# Patient Record
Sex: Female | Born: 1990 | Race: White | Hispanic: No | Marital: Single | Smoking: Never smoker
Health system: Southern US, Community
[De-identification: ages and names within clinical notes are randomized; demographics above are authoritative.]

## PROBLEM LIST (undated history)

## (undated) DIAGNOSIS — F39 Unspecified mood [affective] disorder: Secondary | ICD-10-CM

## (undated) DIAGNOSIS — F633 Trichotillomania: Secondary | ICD-10-CM

## (undated) DIAGNOSIS — F429 Obsessive-compulsive disorder, unspecified: Secondary | ICD-10-CM

## (undated) DIAGNOSIS — F329 Major depressive disorder, single episode, unspecified: Secondary | ICD-10-CM

## (undated) DIAGNOSIS — F419 Anxiety disorder, unspecified: Secondary | ICD-10-CM

## (undated) DIAGNOSIS — F32A Depression, unspecified: Secondary | ICD-10-CM

## (undated) DIAGNOSIS — A879 Viral meningitis, unspecified: Secondary | ICD-10-CM

## (undated) HISTORY — DX: Viral meningitis, unspecified: A87.9

## (undated) HISTORY — DX: Obsessive-compulsive disorder, unspecified: F42.9

## (undated) HISTORY — DX: Trichotillomania: F63.3

## (undated) HISTORY — PX: MOUTH SURGERY: SHX715

---

## 2011-07-20 ENCOUNTER — Emergency Department (HOSPITAL_COMMUNITY)
Admission: EM | Admit: 2011-07-20 | Discharge: 2011-07-21 | Disposition: A | Discharge status: Home / Self Care | Payer: Medicaid Other | Attending: Emergency Medicine | Admitting: Emergency Medicine

## 2011-07-20 DIAGNOSIS — T438X2A Poisoning by other psychotropic drugs, intentional self-harm, initial encounter: Secondary | ICD-10-CM | POA: Insufficient documentation

## 2011-07-20 DIAGNOSIS — T43294A Poisoning by other antidepressants, undetermined, initial encounter: Secondary | ICD-10-CM | POA: Insufficient documentation

## 2011-07-20 DIAGNOSIS — F411 Generalized anxiety disorder: Secondary | ICD-10-CM | POA: Insufficient documentation

## 2011-07-20 DIAGNOSIS — F3289 Other specified depressive episodes: Secondary | ICD-10-CM | POA: Insufficient documentation

## 2011-07-20 DIAGNOSIS — R45851 Suicidal ideations: Secondary | ICD-10-CM | POA: Insufficient documentation

## 2011-07-20 DIAGNOSIS — F329 Major depressive disorder, single episode, unspecified: Secondary | ICD-10-CM | POA: Insufficient documentation

## 2011-07-20 DIAGNOSIS — T43502A Poisoning by unspecified antipsychotics and neuroleptics, intentional self-harm, initial encounter: Secondary | ICD-10-CM | POA: Insufficient documentation

## 2011-07-20 DIAGNOSIS — Z79899 Other long term (current) drug therapy: Secondary | ICD-10-CM | POA: Insufficient documentation

## 2011-07-20 LAB — COMPREHENSIVE METABOLIC PANEL
ALT: 14 U/L (ref 0–35)
AST: 22 U/L (ref 0–37)
Alkaline Phosphatase: 52 U/L (ref 39–117)
CO2: 27 mEq/L (ref 19–32)
Calcium: 10.5 mg/dL (ref 8.4–10.5)
GFR calc Af Amer: >60 mL/min (ref >60)
GFR calc non Af Amer: >60 mL/min (ref >60)
Glucose, Bld: 84 mg/dL (ref 70–99)
Potassium: 3.7 mEq/L (ref 3.5–5.1)
Sodium: 137 mEq/L (ref 135–145)
Total Protein: 9.4 g/dL — ABNORMAL HIGH (ref 6.0–8.3)

## 2011-07-20 LAB — URINE MICROSCOPIC-ADD ON

## 2011-07-20 LAB — DIFFERENTIAL
Basophils Absolute: 0 10*3/uL (ref 0.0–0.1)
Basophils Relative: 0 % (ref 0–1)
Lymphocytes Relative: 25 % (ref 12–46)
Neutro Abs: 6.3 10*3/uL (ref 1.7–7.7)
Neutrophils Relative %: 63 % (ref 43–77)

## 2011-07-20 LAB — URINALYSIS, ROUTINE W REFLEX MICROSCOPIC
Glucose, UA: NEGATIVE mg/dL
Specific Gravity, Urine: 1.022 (ref 1.005–1.030)
Urobilinogen, UA: 1 mg/dL (ref 0.0–1.0)

## 2011-07-20 LAB — CBC
HCT: 43 % (ref 36.0–46.0)
Hemoglobin: 14.6 g/dL (ref 12.0–15.0)
WBC: 10.1 10*3/uL (ref 4.0–10.5)

## 2011-07-20 LAB — RAPID URINE DRUG SCREEN, HOSP PERFORMED: Opiates: NOT DETECTED

## 2011-07-20 LAB — POCT PREGNANCY, URINE: Preg Test, Ur: NEGATIVE

## 2011-07-21 LAB — ACETAMINOPHEN LEVEL: Acetaminophen (Tylenol), Serum: <15 ug/mL (ref 10–30)

## 2011-07-21 LAB — SALICYLATE LEVEL: Salicylate Lvl: <2 mg/dL — ABNORMAL LOW (ref 2.8–20.0)

## 2012-04-08 ENCOUNTER — Emergency Department (HOSPITAL_COMMUNITY): Payer: PRIVATE HEALTH INSURANCE

## 2012-04-08 ENCOUNTER — Emergency Department (HOSPITAL_COMMUNITY)
Admission: EM | Admit: 2012-04-08 | Discharge: 2012-04-09 | Disposition: A | Discharge status: Home / Self Care | Payer: PRIVATE HEALTH INSURANCE | Attending: Emergency Medicine | Admitting: Emergency Medicine

## 2012-04-08 ENCOUNTER — Other Ambulatory Visit: Payer: Self-pay

## 2012-04-08 ENCOUNTER — Encounter (HOSPITAL_COMMUNITY): Payer: Self-pay | Admitting: Emergency Medicine

## 2012-04-08 DIAGNOSIS — F341 Dysthymic disorder: Secondary | ICD-10-CM | POA: Insufficient documentation

## 2012-04-08 DIAGNOSIS — Z79899 Other long term (current) drug therapy: Secondary | ICD-10-CM | POA: Insufficient documentation

## 2012-04-08 DIAGNOSIS — F39 Unspecified mood [affective] disorder: Secondary | ICD-10-CM | POA: Insufficient documentation

## 2012-04-08 DIAGNOSIS — F329 Major depressive disorder, single episode, unspecified: Secondary | ICD-10-CM | POA: Insufficient documentation

## 2012-04-08 DIAGNOSIS — F419 Anxiety disorder, unspecified: Secondary | ICD-10-CM | POA: Insufficient documentation

## 2012-04-08 DIAGNOSIS — R1013 Epigastric pain: Secondary | ICD-10-CM | POA: Insufficient documentation

## 2012-04-08 DIAGNOSIS — K219 Gastro-esophageal reflux disease without esophagitis: Secondary | ICD-10-CM | POA: Insufficient documentation

## 2012-04-08 DIAGNOSIS — R079 Chest pain, unspecified: Secondary | ICD-10-CM | POA: Insufficient documentation

## 2012-04-08 HISTORY — DX: Major depressive disorder, single episode, unspecified: F32.9

## 2012-04-08 HISTORY — DX: Anxiety disorder, unspecified: F41.9

## 2012-04-08 HISTORY — DX: Unspecified mood (affective) disorder: F39

## 2012-04-08 HISTORY — DX: Depression, unspecified: F32.A

## 2012-04-08 LAB — CBC
HCT: 39.6 % (ref 36.0–46.0)
Hemoglobin: 13.6 g/dL (ref 12.0–15.0)
RBC: 4.53 MIL/uL (ref 3.87–5.11)
WBC: 13.5 10*3/uL — ABNORMAL HIGH (ref 4.0–10.5)

## 2012-04-08 LAB — COMPREHENSIVE METABOLIC PANEL
ALT: 22 U/L (ref 0–35)
Alkaline Phosphatase: 57 U/L (ref 39–117)
CO2: 25 mEq/L (ref 19–32)
Chloride: 101 mEq/L (ref 96–112)
GFR calc Af Amer: >90 mL/min (ref >90)
Glucose, Bld: 87 mg/dL (ref 70–99)
Potassium: 4 mEq/L (ref 3.5–5.1)
Sodium: 138 mEq/L (ref 135–145)
Total Bilirubin: 0.4 mg/dL (ref 0.3–1.2)
Total Protein: 8.4 g/dL — ABNORMAL HIGH (ref 6.0–8.3)

## 2012-04-08 NOTE — ED Notes (Signed)
Dr.Opitz to eval ecg at 23:08

## 2012-04-08 NOTE — ED Notes (Signed)
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  Denies radiation of pain.  Reports shortness of breath and nausea.  Denies any cardiac history.

## 2012-04-09 LAB — DIFFERENTIAL
Basophils Relative: 0 % (ref 0–1)
Eosinophils Absolute: 0.3 10*3/uL (ref 0.0–0.7)
Eosinophils Relative: 2 % (ref 0–5)
Lymphocytes Relative: 25 % (ref 12–46)
Monocytes Relative: 7 % (ref 3–12)
Neutro Abs: 8.9 10*3/uL — ABNORMAL HIGH (ref 1.7–7.7)
Neutrophils Relative %: 66 % (ref 43–77)

## 2012-04-09 MED ORDER — PANTOPRAZOLE SODIUM 20 MG PO TBEC
20.0000 mg | DELAYED_RELEASE_TABLET | Freq: Once | ORAL | Status: AC
  Administered 2012-04-09: 20 mg via ORAL
  Filled 2012-04-09: qty 1

## 2012-04-09 MED ORDER — GI COCKTAIL ~~LOC~~
30.0000 mL | Freq: Once | ORAL | Status: AC
  Administered 2012-04-09: 30 mL via ORAL
  Filled 2012-04-09: qty 30

## 2012-04-09 MED ORDER — PANTOPRAZOLE SODIUM 20 MG PO TBEC: 20.0000 mg | DELAYED_RELEASE_TABLET | Freq: Once | ORAL | Status: DC

## 2012-04-09 NOTE — ED Provider Notes (Signed)
History     CSN: [id number]  Arrival date & time 04/08/12  2304   First MD Initiated Contact with Patient 04/09/12 0103      Chief Complaint  Patient presents with  . Chest Pain    (Consider location/radiation/quality/duration/timing/severity/associated sxs/prior treatment) HPI Comments: Patient here with acute onset of epigastric pain with radiation up into her chest area - she states that the pain started about 9pm, states that she has had similar pain in the past - states that the previous pain lasted about 1 hour and resolved on it's own - [facility] at that time and they were unable to tell her the cause - she reports that the pain is dull and achy and is beginning to dissapate on it's own - she states that when it was at it's worst it caused nausea and shortness of breath.  She denies fever, chills, cough, congestion, reports she is a non-smoker but does take BCP - denies anxiety, palpiatations, fluttering in her chest.    Patient is a 21 y.o. female presenting with chest pain. The history is provided by the patient. No language interpreter was used.  Chest Pain The chest pain began 3 - 5 hours ago. Chest pain occurs constantly. The chest pain is improving. At its most intense, the pain is at 6/10. The pain is currently at 3/10. The severity of the pain is moderate. The quality of the pain is described as aching and dull. The pain radiates to the epigastrium. Chest pain is worsened by eating. Primary symptoms include shortness of breath and nausea. Pertinent negatives for primary symptoms include no fever, no fatigue, no syncope, no cough, no wheezing, no palpitations, no abdominal pain, no vomiting, no dizziness and no altered mental status.  Pertinent negatives for associated symptoms include no claudication, no diaphoresis, no lower extremity edema, no near-syncope, no numbness, no orthopnea, no paroxysmal nocturnal dyspnea and no weakness. She tried nothing for the  symptoms. Risk factors include oral contraceptive use.     Past Medical History  Diagnosis Date  . Depression   . Anxiety   . Mood disorder     Past Surgical History  Procedure Date  . Mouth surgery     History reviewed. No pertinent family history.  History  Substance Use Topics  . Smoking status: Never Smoker   . Smokeless tobacco: Not on file  . Alcohol Use: Yes     Occassional Use    OB History    Grav Para Term Preterm Abortions TAB SAB Ect Mult Living                  Review of Systems  Constitutional: Negative for fever, diaphoresis and fatigue.  Respiratory: Positive for shortness of breath. Negative for cough and wheezing.   Cardiovascular: Positive for chest pain. Negative for palpitations, orthopnea, claudication, syncope and near-syncope.  Gastrointestinal: Positive for nausea. Negative for vomiting and abdominal pain.  Neurological: Negative for dizziness, weakness and numbness.  Psychiatric/Behavioral: Negative for altered mental status.  All other systems reviewed and are negative.    Allergies  Review of patient's allergies indicates no known allergies.  Home Medications   Current Outpatient Rx  Name Route Sig Dispense Refill  . BIOTIN 5000 PO Oral Take 1 tablet by mouth daily.    . DESVENLAFAXINE SUCCINATE ER 50 MG PO TB24 Oral Take 50 mg by mouth daily.    Marland Kitchen LAMOTRIGINE 25 MG PO TABS Oral Take 50 mg by  mouth daily.    . ADULT MULTIVITAMIN W/MINERALS CH Oral Take 1 tablet by mouth daily.    Marland Kitchen PRESCRIPTION MEDICATION Oral Take 1 tablet by mouth daily.    . QUETIAPINE FUMARATE 25 MG PO TABS Oral Take 25 mg by mouth at bedtime.    Marland Kitchen VITAMIN C 500 MG PO TABS Oral Take 500 mg by mouth daily.      BP 114/86  Pulse 72  Temp(Src) 98.7 F (37.1 C) (Oral)  Resp 18  SpO2 100%  LMP 03/11/2012  Physical Exam  Nursing note and vitals reviewed. Constitutional: She is oriented to person, place, and time. She appears well-developed and  well-nourished. No distress.  HENT:  Head: Normocephalic and atraumatic.  Right Ear: External ear normal.  Left Ear: External ear normal.  Nose: Nose normal.  Mouth/Throat: Oropharynx is clear and moist. No oropharyngeal exudate.  Eyes: Conjunctivae are normal. Pupils are equal, round, and reactive to light. No scleral icterus.  Neck: Normal range of motion. Neck supple.  Cardiovascular: Normal rate, regular rhythm and normal heart sounds.  Exam reveals no gallop and no friction rub.   No murmur heard. Pulmonary/Chest: Effort normal and breath sounds normal. No respiratory distress. She has no wheezes. She has no rales. She exhibits no tenderness.  Abdominal: Soft. Bowel sounds are normal. She exhibits no distension and no mass. There is tenderness in the epigastric area. There is no rebound and no guarding.    Musculoskeletal: Normal range of motion. She exhibits no edema and no tenderness.  Lymphadenopathy:    She has no cervical adenopathy.  Neurological: She is alert and oriented to person, place, and time. No cranial nerve deficit. She exhibits normal muscle tone. Coordination normal.  Skin: Skin is warm and dry. No rash noted. No erythema. No pallor.  Psychiatric: She has a normal mood and affect. Her behavior is normal. Judgment and thought content normal.    ED Course  Procedures (including critical care time)  Labs Reviewed  CBC - Abnormal; Notable for the following:    WBC 13.5 (*)    All other components within normal limits  DIFFERENTIAL - Abnormal; Notable for the following:    Neutro Abs 8.9 (*)    All other components within normal limits  COMPREHENSIVE METABOLIC PANEL - Abnormal; Notable for the following:    Total Protein 8.4 (*)    AST 43 (*)    All other components within normal limits  POCT I-STAT TROPONIN I   Dg Chest 2 View  04/09/2012  *RADIOLOGY REPORT*  Clinical Data: Chest pain.  CHEST - 2 VIEW  Comparison: None  Findings: The cardiac silhouette,  mediastinal and hilar contours are normal.  Mild bronchitic changes may suggest bronchitis.  No focal infiltrates, edema or effusions.  The bony thorax is intact.  IMPRESSION: Mild bronchitic changes but no infiltrates or effusions.  Original Report Authenticated By: P. Loralie Champagne, M.D.   Results for orders placed during the hospital encounter of 04/08/12  CBC      Component Value Range   WBC 13.5 (*) 4.0 - 10.5 (K/uL)   RBC 4.53  3.87 - 5.11 (MIL/uL)   Hemoglobin 13.6  12.0 - 15.0 (g/dL)   HCT 16.1  09.6 - 04.5 (%)   MCV 87.4  78.0 - 100.0 (fL)   MCH 30.0  26.0 - 34.0 (pg)   MCHC 34.3  30.0 - 36.0 (g/dL)   RDW 40.9  81.1 - 91.4 (%)   Platelets 282  150 - 400 (K/uL)  DIFFERENTIAL      Component Value Range   Neutrophils Relative 66  43 - 77 (%)   Lymphocytes Relative 25  12 - 46 (%)   Monocytes Relative 7  3 - 12 (%)   Eosinophils Relative 2  0 - 5 (%)   Basophils Relative 0  0 - 1 (%)   Neutro Abs 8.9 (*) 1.7 - 7.7 (K/uL)   Lymphs Abs 3.4  0.7 - 4.0 (K/uL)   Monocytes Absolute 0.9  0.1 - 1.0 (K/uL)   Eosinophils Absolute 0.3  0.0 - 0.7 (K/uL)   Basophils Absolute 0.0  0.0 - 0.1 (K/uL)   Smear Review MORPHOLOGY UNREMARKABLE    COMPREHENSIVE METABOLIC PANEL      Component Value Range   Sodium 138  135 - 145 (mEq/L)   Potassium 4.0  3.5 - 5.1 (mEq/L)   Chloride 101  96 - 112 (mEq/L)   CO2 25  19 - 32 (mEq/L)   Glucose, Bld 87  70 - 99 (mg/dL)   BUN 12  6 - 23 (mg/dL)   Creatinine, Ser 1.61  0.50 - 1.10 (mg/dL)   Calcium 09.6  8.4 - 10.5 (mg/dL)   Total Protein 8.4 (*) 6.0 - 8.3 (g/dL)   Albumin 4.1  3.5 - 5.2 (g/dL)   AST 43 (*) 0 - 37 (U/L)   ALT 22  0 - 35 (U/L)   Alkaline Phosphatase 57  39 - 117 (U/L)   Total Bilirubin 0.4  0.3 - 1.2 (mg/dL)   GFR calc non Af Amer >90  >90 (mL/min)   GFR calc Af Amer >90  >90 (mL/min)  POCT I-STAT TROPONIN I      Component Value Range   Troponin i, poc 0.00  0.00 - 0.08 (ng/mL)   Comment 3            Dg Chest 2 View  04/09/2012   *RADIOLOGY REPORT*  Clinical Data: Chest pain.  CHEST - 2 VIEW  Comparison: None  Findings: The cardiac silhouette, mediastinal and hilar contours are normal.  Mild bronchitic changes may suggest bronchitis.  No focal infiltrates, edema or effusions.  The bony thorax is intact.  IMPRESSION: Mild bronchitic changes but no infiltrates or effusions.  Original Report Authenticated By: P. Loralie Champagne, M.D.    Date: 04/09/2012  Rate: 84  Rhythm: normal sinus rhythm  QRS Axis: normal  Intervals: normal  ST/T Wave abnormalities: normal  Conduction Disutrbances:none  Narrative Interpretation: Reviewed by Dr. Dierdre Highman  Old EKG Reviewed: none available     Gastritis   MDM  Patient here with transient epigastric pain - reports relief after GI cocktail.  Labs re-assuring and ECG and x-ray are basically normal - clinically does not appear to have bronchitis.  Will prescribe the protonix and have asked that the patient get follow up with PCP.        Izola Price Quail, [place] 04/09/12 [phone]

## 2012-04-09 NOTE — Discharge Instructions (Signed)
Diet for GERD or PUD  Nutrition therapy can help ease the discomfort of gastroesophageal reflux disease (GERD) and peptic ulcer disease (PUD).   HOME CARE INSTRUCTIONS    Eat your meals slowly, in a relaxed setting.   Eat 5 to 6 small meals per day.   If a food causes distress, stop eating it for a period of time.  FOODS TO AVOID   Coffee, regular or decaffeinated.   Cola beverages, regular or low calorie.   Tea, regular or decaffeinated.   Pepper.   Cocoa.   High fat foods, including meats.   Butter, margarine, hydrogenated oil (trans fats).   Peppermint or spearmint (if you have GERD).   Fruits and vegetables if not tolerated.   Alcohol.   Nicotine (smoking or chewing). This is one of the most potent stimulants to acid production in the gastrointestinal tract.   Any food that seems to aggravate your condition.  If you have questions regarding your diet, ask your caregiver or a registered dietitian.  TIPS   Lying flat may make symptoms worse. Keep the head of your bed raised 6 to 9 inches (15 to 23 cm) by using a foam wedge or blocks under the legs of the bed.   Do not lay down until 3 hours after eating a meal.   Daily physical activity may help reduce symptoms.  MAKE SURE YOU:    Understand these instructions.   Will watch your condition.   Will get help right away if you are not doing well or get worse.  Document Released: 10/23/2005 Document Revised: 10/12/2011 Document Reviewed: 09/08/2011  Exit[employer] Patient Information 2012 [employer]    Gastroesophageal Reflux Disease, Adult  Gastroesophageal reflux disease (GERD) happens when acid from your stomach flows up into the esophagus. When acid comes in contact with the esophagus, the acid causes soreness (inflammation) in the esophagus. Over time, GERD may create small holes (ulcers) in the lining of the esophagus.  CAUSES    Increased body weight. This puts pressure on the stomach, making acid rise from the stomach into the  esophagus.   Smoking. This increases acid production in the stomach.   Drinking alcohol. This causes decreased pressure in the lower esophageal sphincter (valve or ring of muscle between the esophagus and stomach), allowing acid from the stomach into the esophagus.   Late evening meals and a full stomach. This increases pressure and acid production in the stomach.   A malformed lower esophageal sphincter.  Sometimes, no cause is found.  SYMPTOMS    Burning pain in the lower part of the mid-chest behind the breastbone and in the mid-stomach area. This may occur twice a week or more often.   Trouble swallowing.   Sore throat.   Dry cough.   Asthma-like symptoms including chest tightness, shortness of breath, or wheezing.  DIAGNOSIS   Your caregiver may be able to diagnose GERD based on your symptoms. In some cases, X-rays and other tests may be done to check for complications or to check the condition of your stomach and esophagus.  TREATMENT   Your caregiver may recommend over-the-counter or prescription medicines to help decrease acid production. Ask your caregiver before starting or adding any new medicines.   HOME CARE INSTRUCTIONS    Change the factors that you can control. Ask your caregiver for guidance concerning weight loss, quitting smoking, and alcohol consumption.   Avoid foods and drinks that make your symptoms worse, such as:   Caffeine or   alcoholic drinks.   Chocolate.   Peppermint or mint flavorings.   Garlic and onions.   Spicy foods.   Citrus fruits, such as oranges, lemons, or limes.   Tomato-based foods such as sauce, chili, salsa, and pizza.   Fried and fatty foods.   Avoid lying down for the 3 hours prior to your bedtime or prior to taking a nap.   Eat small, frequent meals instead of large meals.   Wear loose-fitting clothing. Do not wear anything tight around your waist that causes pressure on your stomach.   Raise the head of your bed 6 to 8 inches with wood blocks to  help you sleep. Extra pillows will not help.   Only take over-the-counter or prescription medicines for pain, discomfort, or fever as directed by your caregiver.   Do not take aspirin, ibuprofen, or other nonsteroidal anti-inflammatory drugs (NSAIDs).  SEEK IMMEDIATE MEDICAL CARE IF:    You have pain in your arms, neck, jaw, teeth, or back.   Your pain increases or changes in intensity or duration.   You develop nausea, vomiting, or sweating (diaphoresis).   You develop shortness of breath, or you faint.   Your vomit is green, yellow, black, or looks like coffee grounds or blood.   Your stool is red, bloody, or black.  These symptoms could be signs of other problems, such as heart disease, gastric bleeding, or esophageal bleeding.  MAKE SURE YOU:    Understand these instructions.   Will watch your condition.   Will get help right away if you are not doing well or get worse.  Document Released: 08/02/2005 Document Revised: 10/12/2011 Document Reviewed: 05/12/2011  [employer] Patient Information 2012 [employer]

## 2012-04-09 NOTE — ED Provider Notes (Signed)
Medical screening examination/treatment/procedure(s) were performed by non-physician practitioner and as supervising physician I was immediately available for consultation/collaboration.    Vida Roller, MD 04/09/12 [phone]

## 2012-11-06 DIAGNOSIS — A879 Viral meningitis, unspecified: Secondary | ICD-10-CM

## 2012-11-06 HISTORY — DX: Viral meningitis, unspecified: A87.9

## 2014-03-31 ENCOUNTER — Ambulatory Visit (INDEPENDENT_AMBULATORY_CARE_PROVIDER_SITE_OTHER): Payer: BC Managed Care – PPO

## 2014-03-31 VITALS — BP 103/67 | HR 100 | Resp 12

## 2014-03-31 DIAGNOSIS — L03039 Cellulitis of unspecified toe: Secondary | ICD-10-CM

## 2014-03-31 DIAGNOSIS — L600 Ingrowing nail: Secondary | ICD-10-CM

## 2014-03-31 NOTE — Patient Instructions (Addendum)
Betadine Soak Instructions  Purchase an 8 oz. bottle of BETADINE solution (Povidone)  THE DAY AFTER THE PROCEDURE  Place 1 tablespoon of betadine solution in a quart of warm tap water.  Submerge your foot or feet with outer bandage intact for the initial soak; this will allow the bandage to become moist and wet for easy lift off.  Once you remove your bandage, continue to soak in the solution for 20 minutes.  This soak should be done twice a day.  Next, remove your foot or feet from solution, blot dry the affected area and cover.  You may use a band aid large enough to cover the area or use gauze and tape.  Apply other medications to the area as directed by the doctor such as cortisporin otic solution (ear drops) or neosporin.  IF YOUR SKIN BECOMES IRRITATED WHILE USING THESE INSTRUCTIONS, IT IS OKAY TO SWITCH TO EPSOM SALTS AND WATER OR WHITE VINEGAR AND WATER.  Soak twice daily for the first couple days and once a day has long-standing discharge or drainage keep soaking and applying Neosporin and Band-Aid dressing daily followup in 2-3 for nail check and reevaluation

## 2014-03-31 NOTE — Progress Notes (Signed)
   Subjective:    Patient ID: Peggy Mcdonald, female    DOB: 07-04-1991, 23 y.o.   MRN: [id number]  HPI PT STATED RT FOOT GREAT TOENAIL IS BEEN SORE ON AND OFF FOR 1 YEAR. THE TOENAIL IS A LITTLE BETTER TODAY. THE TOENAIL GET AGGRAVATED BY PRESSURE ON IT. TRIED TO SOAK WITH WARM WATER AND PEROXIDE AND IT HELP SOME.    Review of Systems  Allergic/Immunologic: Positive for environmental allergies and food allergies.  All other systems reviewed and are negative.      Objective:   Physical Exam Neurovascular status is intact with pedal pulses palpable DP and PT +2/4 bilateral capillary refill time 3 seconds epicritic and proprioceptive sensations intact and symmetric bilateral there is normal plantar response and DTRs dermatologically skin color pigment normal hair growth absent nails criptotic reticular lateral border right hallux show some small granulation tissue erythema and in no edema no purulence are noted patient is on an antibiotic penicillin for other issue however should continue cover for her nails well orthopedic biomechanical exam unremarkable rectus foot type noted there is no a sitting psoas lymphangitis no secondary infection is noted       Assessment & Plan:  Assessment ingrowing nail lateral border right hallux with paronychia plan at this time is AP nail procedure with nail excision lateral border with phenol matricectomy followed by alcohol wash Betadine ointment and dressed with dressing applied patient given instructions for Betadine soap and Epson salt soaks Neosporin and Band-Aid dressing daily patient has prescription already been given for penicillin 100 mg will maintain penicillin dose as instructed until completed daily soaking for as instructed Peggy Mcdonald instructions given recommended Tylenol or Advil as needed for pain recheck in 2-3 for followup for nail check  Alvan Dame DPM

## 2014-04-21 ENCOUNTER — Ambulatory Visit (INDEPENDENT_AMBULATORY_CARE_PROVIDER_SITE_OTHER): Payer: BC Managed Care – PPO

## 2014-04-21 VITALS — BP 128/78 | HR 75 | Resp 16

## 2014-04-21 DIAGNOSIS — L03039 Cellulitis of unspecified toe: Secondary | ICD-10-CM

## 2014-04-21 DIAGNOSIS — Z09 Encounter for follow-up examination after completed treatment for conditions other than malignant neoplasm: Secondary | ICD-10-CM

## 2014-04-21 DIAGNOSIS — L600 Ingrowing nail: Secondary | ICD-10-CM

## 2014-04-21 NOTE — Patient Instructions (Signed)
Keep the nails trimmed straight across do not dig in the corners or in the nail folds.  May resume normal bathing and hygiene and can resume applying nail polish without restrictions at this time.  Contact us if any recurrence or new problems arise

## 2014-04-21 NOTE — Progress Notes (Signed)
   Subjective:    Patient ID: Peggy Mcdonald, female    DOB: 08/10/1991, 23 y.o.   MRN: [id number]  HPI Pt being seen for nail recheck. No pain or drainage noted   Review of Systems no new findings or systemic changes noted     Objective:   Physical Exam Neurovascular status is intact pedal pulses palpable patient is to 3 weeks status post AP nail right great toe no discharge no drainage minimal erythema noted completely resolved patient will resume all normal bathing care and hygiene can resume getting a Set designermanicure pedicure and putting or nail polish without restrictions at this time to       Assessment & Plan:  Assessment good postop progress status post AP nail procedure right great toe lateral border discharge to an as-needed basis for followup next  Alvan Dameichard Sikora DPM

## 2014-08-26 ENCOUNTER — Ambulatory Visit (HOSPITAL_COMMUNITY): Payer: PRIVATE HEALTH INSURANCE | Admitting: Physician Assistant

## 2014-08-28 ENCOUNTER — Encounter (INDEPENDENT_AMBULATORY_CARE_PROVIDER_SITE_OTHER): Payer: Self-pay

## 2014-08-28 ENCOUNTER — Ambulatory Visit (INDEPENDENT_AMBULATORY_CARE_PROVIDER_SITE_OTHER): Payer: BLUE CROSS/BLUE SHIELD | Admitting: Physician Assistant

## 2014-08-28 ENCOUNTER — Encounter (HOSPITAL_COMMUNITY): Payer: Self-pay | Admitting: Physician Assistant

## 2014-08-28 VITALS — BP 127/91 | HR 81 | Ht 61.5 in | Wt 179.0 lb

## 2014-08-28 DIAGNOSIS — F633 Trichotillomania: Secondary | ICD-10-CM | POA: Diagnosis not present

## 2014-08-28 DIAGNOSIS — F331 Major depressive disorder, recurrent, moderate: Secondary | ICD-10-CM | POA: Diagnosis not present

## 2014-08-28 DIAGNOSIS — F411 Generalized anxiety disorder: Secondary | ICD-10-CM

## 2014-08-28 MED ORDER — ESCITALOPRAM OXALATE 20 MG PO TABS: 20.0000 mg | ORAL_TABLET | Freq: Every day | ORAL | Status: DC

## 2014-08-28 NOTE — Patient Instructions (Signed)
1. Take Vitamin D3 2000 IU 2 a day. 2. B12 Complex 1 a day. 3. Continue all medication as noted. 4. Get labs completed today. 5. See each academic instructor face to face today!!!  6. Anyone you didn't see today, see face to face on Monday! 7. Make a game plan for going to class as discussed. 8. Follow up in 2 weeks.  Bring notes on plans for recovery for this semester. 9. Call for questions or problems. 10. Set alarm clock each night and have a friend/sister call you to get up. 11. GO TO CLASS!!!! 12. Avoid all alcohol and drugs.

## 2014-08-28 NOTE — Progress Notes (Signed)
Psychiatric Assessment Adult  Patient Identification:  Peggy Mcdonald Date of Evaluation:  08/28/2014 Chief Complaint: Depression History of Chief Complaint:   Chief Complaint  Patient presents with  . Medication Refill  . Follow-up  . Depression  . Anxiety    HPI Comments: Patient notes that she has had a history of depression for 4 years. She has had a number of psychiatrists and tried a number of medications. She has had one hospitialization for suicide attempt in 2012.    She is currently enrolled at [employer] in Human development but is not going to classes. Her past medical history is unremarkable with the exception of a viral meningitis for which she was hospitalized in [place] last year.    Peggy Mcdonald states that she feels that her Lexapro isn't working as she is depressed. She says she sleeps too much, has fatigue. She has not been going to class and says it is due to social anxiety, but denies panic. She notes that she is also not eating well and does not exercise. She says she is depressed due to a bad break up and she just can't seem to let it go...she does go to therapy and is able to go get her nails done regularly, and she smokes pot about 1 x a week. She is currently doing poorly in 3 out of the 4 classes she is taking.   Anxiety Presents for initial visit. Symptoms include confusion and dizziness.     Review of Systems  Constitutional: Positive for appetite change, fatigue and unexpected weight change.  Gastrointestinal: Positive for blood in stool.  Neurological: Positive for dizziness.  Psychiatric/Behavioral: Positive for confusion, dysphoric mood and agitation.   Physical Exam  Depressive Symptoms: depressed mood, hypersomnia, fatigue, difficulty concentrating, anxiety, loss of energy/fatigue, weight gain, increased appetite,  (Hypo) Manic Symptoms:   Elevated Mood:  No Irritable Mood:  No Grandiosity:  No Distractibility:  No Labiality of Mood:   No Delusions:  No Hallucinations:  No Impulsivity:  No Sexually Inappropriate Behavior:  No Financial Extravagance:  Yes Flight of Ideas:  No  Anxiety Symptoms: Excessive Worry:  Yes Panic Symptoms:  No Agoraphobia:  No Obsessive Compulsive: No  Symptoms: None, Specific Phobias:  No Social Anxiety:  Yes  Psychotic Symptoms:  Hallucinations: No  Delusions:  No Paranoia:  No   Ideas of Reference:  No  PTSD Symptoms: Ever had a traumatic exposure:  No Had a traumatic exposure in the last month:  No Re-experiencing: No None Hypervigilance:  No Hyperarousal: No None Avoidance: No None  Traumatic Brain Injury: No   Past Psychiatric History: Diagnosis: Depression,   Hospitalizations: x1  Outpatient Care: [employer]  Substance Abuse Care: na  Self-Mutilation: scratches in the past  Suicidal Attempts: 1  Violent Behaviors: denies   Past Medical History:   Past Medical History  Diagnosis Date  . Depression   . Anxiety   . Mood disorder   . Obsessive-compulsive disorder    History of Loss of Consciousness:  No Seizure History:  No Cardiac History:  Yes Allergies:  No Known Allergies Current Medications:  Current Outpatient Prescriptions  Medication Sig Dispense Refill  . BIOTIN 5000 PO Take 1 tablet by mouth daily.      Marland Kitchen. escitalopram (LEXAPRO) 20 MG tablet       . Multiple Vitamin (MULITIVITAMIN WITH MINERALS) TABS Take 1 tablet by mouth daily.      . penicillin v potassium (VEETID) 500 MG tablet       .  PRESCRIPTION MEDICATION Take 1 tablet by mouth daily.      Marland Kitchen. nystatin ointment (MYCOSTATIN)       . pantoprazole (PROTONIX) 20 MG tablet Take 1 tablet (20 mg total) by mouth once.  30 tablet  0   No current facility-administered medications for this visit.    Previous Psychotropic Medications:  Medication Dose  Celexa     Lamictal    Seroquel    Welbutrin    Pristique           Substance Abuse History in the last 12 months: THC 1 x per week.  Medical  Consequences of Substance Abuse:   Legal Consequences of Substance Abuse:   Family Consequences of Substance Abuse:   Blackouts:   DT's:   Withdrawal Symptoms:     Social History: [place] Family Members: Parents, Sister Marital Status:  Single Children:   Sons:   Daughters:  Relationships:  Education:  [employer] Educational Problems/Performance:  Religious Beliefs/Practices:  History of Abuse: none Teacher, musicccupational Experiences; Military History:  None. Legal History:  Hobbies/Interests:   Family History:   Family History  Problem Relation Age of Onset  . Alcohol abuse Mother   . Anxiety disorder Mother   . Depression Mother   . Anxiety disorder Father   . Depression Father   . OCD Father   . Paranoid behavior Father   . Bipolar disorder Father   . ADD / ADHD Sister   . Anxiety disorder Cousin   . Depression Cousin     Mental Status Examination/Evaluation: Objective:  Appearance: Casual  Eye Contact::  Good  Speech:  Clear and Coherent  Volume:  Normal  Mood:  Euthymic, but reports depressed   Affect:  Congruent  smiles  Thought Process:  Goal Directed  Orientation:  Full (Time, Place, and Person)  Thought Content:  WDL  Suicidal Thoughts:  No  Homicidal Thoughts:  No  Judgement:  Poor  Insight:  Shallow  Psychomotor Activity:  Normal  Akathisia:  No  Handed:  Right  AIMS (if indicated):    Assets:  Communication Skills Desire for Improvement Financial Resources/Insurance Housing Physical Health Resilience Social Support Talents/Skills Transportation Vocational/Educational    Laboratory/X-Ray Psychological Evaluation(s)        Assessment:    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 Depression   . Anxiety   . Mood disorder   . Obsessive-compulsive disorder      AXIS IV educational  problems, problems related to social environment and problems with access to health care services  AXIS V 61-70 mild symptoms   Treatment Plan/Recommendations:  Plan of Care: Medication Management 1. Continue Lexapro at this time. 2. Move forward with educational plans as discussed. 3. Labs CMP, UDS, TSH, Vitamin D, CBC  Laboratory:  CBC Chemistry Profile UDS UA TSH  Psychotherapy: Needs CBT in [place]  Medications: as noted, no changes  Routine PRN Medications:  No  Consultations: If needed  Safety Concerns:  None at this time  Other:      Rue Valladares, PA-C 10/23/201510:30 AM

## 2014-08-29 LAB — CBC WITH DIFFERENTIAL/PLATELET
Basophils Absolute: 0 10*3/uL (ref 0.0–0.1)
Basophils Relative: 0 % (ref 0–1)
EOS ABS: 0.3 10*3/uL (ref 0.0–0.7)
EOS PCT: 4 % (ref 0–5)
HEMATOCRIT: 37.4 % (ref 36.0–46.0)
HEMOGLOBIN: 12.2 g/dL (ref 12.0–15.0)
LYMPHS ABS: 2.8 10*3/uL (ref 0.7–4.0)
LYMPHS PCT: 33 % (ref 12–46)
MCH: 28.8 pg (ref 26.0–34.0)
MCHC: 32.6 g/dL (ref 30.0–36.0)
MCV: 88.2 fL (ref 78.0–100.0)
MONOS PCT: 7 % (ref 3–12)
Monocytes Absolute: 0.6 10*3/uL (ref 0.1–1.0)
Neutro Abs: 4.7 10*3/uL (ref 1.7–7.7)
Neutrophils Relative %: 56 % (ref 43–77)
PLATELETS: 410 10*3/uL — AB (ref 150–400)
RBC: 4.24 MIL/uL (ref 3.87–5.11)
RDW: 13.3 % (ref 11.5–15.5)
WBC: 8.4 10*3/uL (ref 4.0–10.5)

## 2014-08-29 LAB — COMPLETE METABOLIC PANEL WITH GFR
ALBUMIN: 4.1 g/dL (ref 3.5–5.2)
ALK PHOS: 54 U/L (ref 39–117)
ALT: 19 U/L (ref 0–35)
AST: 16 U/L (ref 0–37)
BUN: 10 mg/dL (ref 6–23)
CALCIUM: 9.6 mg/dL (ref 8.4–10.5)
CHLORIDE: 101 meq/L (ref 96–112)
CO2: 27 mEq/L (ref 19–32)
Creat: 0.68 mg/dL (ref 0.50–1.10)
GFR, Est African American: >89 mL/min
GFR, Est Non African American: >89 mL/min
Glucose, Bld: 71 mg/dL (ref 70–99)
POTASSIUM: 4.7 meq/L (ref 3.5–5.3)
SODIUM: 136 meq/L (ref 135–145)
TOTAL PROTEIN: 7.4 g/dL (ref 6.0–8.3)
Total Bilirubin: 0.3 mg/dL (ref 0.2–1.2)

## 2014-08-29 LAB — DRUGS OF ABUSE SCREEN W/O ALC, ROUTINE URINE
AMPHETAMINE SCRN UR: NEGATIVE
BENZODIAZEPINES.: NEGATIVE
Barbiturate Quant, Ur: NEGATIVE
COCAINE METABOLITES: NEGATIVE
Creatinine,U: 67 mg/dL
MARIJUANA METABOLITE: NEGATIVE
Methadone: NEGATIVE
Opiate Screen, Urine: POSITIVE — AB
PHENCYCLIDINE (PCP): NEGATIVE
Propoxyphene: NEGATIVE

## 2014-08-29 LAB — TSH: TSH: 1.549 u[IU]/mL (ref 0.350–4.500)

## 2014-08-31 LAB — OPIATES/OPIOIDS (LC/MS-MS)
Codeine Urine: 899 ng/mL — ABNORMAL HIGH (ref <50)
HYDROMORPHONE: NEGATIVE ng/mL (ref <50)
Hydrocodone: NEGATIVE ng/mL (ref <50)
MORPHINE: 225 ng/mL — AB (ref <50)
NORHYDROCODONE, UR: NEGATIVE ng/mL (ref <50)
Noroxycodone, Ur: NEGATIVE ng/mL (ref <50)
OXYCODONE, UR: NEGATIVE ng/mL (ref <50)
OXYMORPHONE, URINE: NEGATIVE ng/mL (ref <50)

## 2014-09-02 ENCOUNTER — Telehealth: Payer: Self-pay | Admitting: Physician Assistant

## 2014-09-02 LAB — VITAMIN D 1,25 DIHYDROXY
Vitamin D 1, 25 (OH)2 Total: 44 pg/mL (ref 18–72)
Vitamin D3 1, 25 (OH)2: 44 pg/mL

## 2014-09-10 NOTE — Telephone Encounter (Signed)
[phone] No Ans. X 2.  LMOM for patient to contact this office regarding her lab results. Rona RavensNeil T. Cinzia Devos RPAC 2:39 PM 09/10/2014

## 2014-09-11 ENCOUNTER — Encounter (HOSPITAL_COMMUNITY): Payer: Self-pay | Admitting: Physician Assistant

## 2014-09-11 ENCOUNTER — Ambulatory Visit (INDEPENDENT_AMBULATORY_CARE_PROVIDER_SITE_OTHER): Payer: BLUE CROSS/BLUE SHIELD | Admitting: Physician Assistant

## 2014-09-11 VITALS — BP 125/74 | HR 92 | Ht 61.5 in

## 2014-09-11 DIAGNOSIS — F329 Major depressive disorder, single episode, unspecified: Secondary | ICD-10-CM

## 2014-09-11 DIAGNOSIS — F331 Major depressive disorder, recurrent, moderate: Secondary | ICD-10-CM

## 2014-09-11 MED ORDER — ESCITALOPRAM OXALATE 20 MG PO TABS: 20.0000 mg | ORAL_TABLET | Freq: Every day | ORAL | Status: AC

## 2014-09-11 NOTE — Patient Instructions (Signed)
1. Continue all medication as ordered. 2. Call this office if you have any questions or concerns. 3. Continue to get regular exercise 3-5 times a week. 4. Continue to eat a healthy nutritionally balanced diet. 5. Continue to reduce stress and anxiety through activities such as yoga, mindfulness, meditation and or prayer. 6. Keep all appointments with your out patient therapist and have notes forwarded to this office. (If you do not have one and would like to be scheduled with a therapist, please let our office assist you with this. 7. Follow up as planned 1 month. 

## 2014-09-11 NOTE — Progress Notes (Signed)
[id number]  Peggy Mcdonald [id number] 23 y.o.  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 Peggy Mcdonald states that she was able to salvage 1 of her classes and has been attending this regularly since her last visit. She has also [place] who feels that she may need an increase in her medication.  Peggy Mcdonald continues to support feeling tired and sluggish a good part of the time even after sleeping all night.  She has made it to the gym with her sister and does plan on continuing to exercise on a regular basis. Suicidal Ideation: No Plan Formed: No Patient has means to carry out plan: No  Homicidal Ideation: No Plan Formed: No Patient has means to carry out plan: No  Review of Systems: Psychiatric: Agitation: No Hallucination: No Depressed Mood: Yes  6-7/10 Insomnia: Yes Hypersomnia: Yes Altered Concentration: Yes Feels Worthless: No Grandiose Ideas: No Belief In Special Powers: No New/Increased Substance Abuse: No Compulsions: No  Neurologic: Headache: No Seizure: No Paresthesias: No  Past Medical Family, Social History: Less social, did drop 3 classes but is continuing in the 4th.  Outpatient Encounter Prescriptions as of 09/11/2014  Medication Sig  . BIOTIN 5000 PO Take 1 tablet by mouth daily.  Marland Kitchen. escitalopram (LEXAPRO) 20 MG tablet Take 1 tablet (20 mg total) by mouth daily.  . Multiple Vitamin (MULITIVITAMIN WITH MINERALS) TABS Take 1 tablet by mouth daily.  . norethindrone-ethinyl estradiol (JUNEL FE,GILDESS FE,LOESTRIN FE) 1-20 MG-MCG tablet Take 1 tablet by mouth.  . nystatin ointment (MYCOSTATIN)   . pantoprazole (PROTONIX) 20 MG tablet Take 1 tablet (20 mg total) by mouth once.  . pantoprazole (PROTONIX) 40 MG tablet   . penicillin  v potassium (VEETID) 500 MG tablet   . PRESCRIPTION MEDICATION Take 1 tablet by mouth daily.  Marland Kitchen. triamcinolone ointment (KENALOG) 0.5 %     Past Psychiatric History/Hospitalization(s): Last admission 2012 Anxiety: Yes 8-9/10 Bipolar Disorder: No Depression: Yes  6-7/10 Mania: No Psychosis: No Schizophrenia: No Personality Disorder: No Hospitalization for psychiatric illness: Yes History of Electroconvulsive Shock Therapy: No Prior Suicide Attempts: Yes  Physical Exam: Constitutional:  BP 125/74 mmHg  Pulse 92  Ht 5' 1.5" (1.562 m)  LMP 08/04/2014  General Appearance: well nourished  Musculoskeletal: Strength & Muscle Tone: within normal limits Gait & Station: normal Patient leans: N/A  Psychiatric: Speech (describe rate, volume, coherence, spontaneity, and abnormalities if any): clear and goal directed  Thought Process (describe rate, content, abstract reasoning, and computation): normal  Associations: Coherent and Relevant  Thoughts: normal  Mental Status: Orientation: oriented to person, place and time/date Mood & Affect: normal affect Attention Span & Concentration: fair  Medical Decision Making (Choose Three): Established Problem, Stable/Improving (1) and New problem, with additional work up planned  Assessment: Did discuss with the patient her positive UDS and she reports having a UDS positive for hydrocodone. This was reported by the patient and supported by Care Everywhere that she had received cough medicine with codeine in it for her significant upper respiratory infection.  She had what sounds like the flu or walking pneumonia. Axis I: MDD.   Plan:  1. Patient is congratulated on making the efforts to speak with her instructors and at least drop the classes she was failing. She is also  praised for continuing to attend class that she is doing well in, especially when it was very likely she is recovering from the flu or pneumonia. 2. Her efforts at going  to the gym were also praised as well. 3. She did ask for an increase in her medication, which at this point, I have elected to hold off on. The reasoning behind this is due to her recent illness and she does need to give herself time to recover from this. 4. I have encouraged her to increase her counseling and to keep those appointments, by setting and keeping a regular schedule. 5. I have also asked Peggy Mcdonald to continue her Vit. D3, and B12 complex. 6. She will follow up in 1 month as planned. 7. She is also asked to sign a release of information for her OPT.  Peggy Mott, PA-C 09/11/2014

## 2015-04-07 ENCOUNTER — Emergency Department (HOSPITAL_COMMUNITY)
Admission: EM | Admit: 2015-04-07 | Discharge: 2015-04-07 | Disposition: A | Discharge status: Home / Self Care | Payer: BLUE CROSS/BLUE SHIELD | Attending: Emergency Medicine | Admitting: Emergency Medicine

## 2015-04-07 ENCOUNTER — Emergency Department (HOSPITAL_COMMUNITY): Payer: BLUE CROSS/BLUE SHIELD

## 2015-04-07 ENCOUNTER — Encounter (HOSPITAL_COMMUNITY): Payer: Self-pay | Admitting: Emergency Medicine

## 2015-04-07 DIAGNOSIS — R51 Headache: Secondary | ICD-10-CM

## 2015-04-07 DIAGNOSIS — F39 Unspecified mood [affective] disorder: Secondary | ICD-10-CM | POA: Insufficient documentation

## 2015-04-07 DIAGNOSIS — Y92410 Unspecified street and highway as the place of occurrence of the external cause: Secondary | ICD-10-CM | POA: Diagnosis not present

## 2015-04-07 DIAGNOSIS — R519 Headache, unspecified: Secondary | ICD-10-CM

## 2015-04-07 DIAGNOSIS — Y9389 Activity, other specified: Secondary | ICD-10-CM | POA: Diagnosis not present

## 2015-04-07 DIAGNOSIS — Z8661 Personal history of infections of the central nervous system: Secondary | ICD-10-CM | POA: Diagnosis not present

## 2015-04-07 DIAGNOSIS — S0990XA Unspecified injury of head, initial encounter: Secondary | ICD-10-CM | POA: Insufficient documentation

## 2015-04-07 DIAGNOSIS — F42 Obsessive-compulsive disorder: Secondary | ICD-10-CM | POA: Insufficient documentation

## 2015-04-07 DIAGNOSIS — F419 Anxiety disorder, unspecified: Secondary | ICD-10-CM | POA: Insufficient documentation

## 2015-04-07 DIAGNOSIS — Y998 Other external cause status: Secondary | ICD-10-CM | POA: Diagnosis not present

## 2015-04-07 DIAGNOSIS — Z79899 Other long term (current) drug therapy: Secondary | ICD-10-CM | POA: Insufficient documentation

## 2015-04-07 DIAGNOSIS — F329 Major depressive disorder, single episode, unspecified: Secondary | ICD-10-CM | POA: Insufficient documentation

## 2015-04-07 MED ORDER — ONDANSETRON 4 MG PO TBDP
4.0000 mg | ORAL_TABLET | Freq: Once | ORAL | Status: AC
  Administered 2015-04-07: 4 mg via ORAL
  Filled 2015-04-07: qty 1

## 2015-04-07 MED ORDER — ACETAMINOPHEN 325 MG PO TABS
650.0000 mg | ORAL_TABLET | Freq: Once | ORAL | Status: AC
  Administered 2015-04-07: 650 mg via ORAL
  Filled 2015-04-07: qty 2
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
## 2015-07-02 ENCOUNTER — Encounter (HOSPITAL_COMMUNITY): Payer: Self-pay | Admitting: Emergency Medicine

## 2015-07-02 ENCOUNTER — Observation Stay (HOSPITAL_COMMUNITY)
Admission: EM | Admit: 2015-07-02 | Discharge: 2015-07-04 | Disposition: A | Discharge status: Home / Self Care | Payer: BLUE CROSS/BLUE SHIELD | Attending: Surgery | Admitting: Surgery

## 2015-07-02 ENCOUNTER — Observation Stay (HOSPITAL_COMMUNITY): Payer: BLUE CROSS/BLUE SHIELD

## 2015-07-02 ENCOUNTER — Emergency Department (HOSPITAL_COMMUNITY): Payer: BLUE CROSS/BLUE SHIELD

## 2015-07-02 DIAGNOSIS — R1011 Right upper quadrant pain: Secondary | ICD-10-CM | POA: Diagnosis present

## 2015-07-02 DIAGNOSIS — K8000 Calculus of gallbladder with acute cholecystitis without obstruction: Secondary | ICD-10-CM | POA: Diagnosis not present

## 2015-07-02 DIAGNOSIS — Z793 Long term (current) use of hormonal contraceptives: Secondary | ICD-10-CM | POA: Insufficient documentation

## 2015-07-02 DIAGNOSIS — E669 Obesity, unspecified: Secondary | ICD-10-CM | POA: Diagnosis not present

## 2015-07-02 DIAGNOSIS — F419 Anxiety disorder, unspecified: Secondary | ICD-10-CM | POA: Insufficient documentation

## 2015-07-02 DIAGNOSIS — F42 Obsessive-compulsive disorder: Secondary | ICD-10-CM | POA: Insufficient documentation

## 2015-07-02 DIAGNOSIS — F329 Major depressive disorder, single episode, unspecified: Secondary | ICD-10-CM | POA: Diagnosis not present

## 2015-07-02 DIAGNOSIS — K819 Cholecystitis, unspecified: Secondary | ICD-10-CM

## 2015-07-02 DIAGNOSIS — Z79899 Other long term (current) drug therapy: Secondary | ICD-10-CM | POA: Diagnosis not present

## 2015-07-02 DIAGNOSIS — K8020 Calculus of gallbladder without cholecystitis without obstruction: Secondary | ICD-10-CM

## 2015-07-02 DIAGNOSIS — R0602 Shortness of breath: Secondary | ICD-10-CM

## 2015-07-02 LAB — URINE MICROSCOPIC-ADD ON

## 2015-07-02 LAB — COMPREHENSIVE METABOLIC PANEL
ALT: 23 U/L (ref 14–54)
AST: 24 U/L (ref 15–41)
Albumin: 4.3 g/dL (ref 3.5–5.0)
Alkaline Phosphatase: 64 U/L (ref 38–126)
Anion gap: 8 (ref 5–15)
BUN: 10 mg/dL (ref 6–20)
CHLORIDE: 103 mmol/L (ref 101–111)
CO2: 24 mmol/L (ref 22–32)
CREATININE: 0.79 mg/dL (ref 0.44–1.00)
Calcium: 9.7 mg/dL (ref 8.9–10.3)
GFR calc non Af Amer: >60 mL/min (ref >60)
Glucose, Bld: 93 mg/dL (ref 65–99)
Potassium: 4 mmol/L (ref 3.5–5.1)
SODIUM: 135 mmol/L (ref 135–145)
Total Bilirubin: 0.6 mg/dL (ref 0.3–1.2)
Total Protein: 8.2 g/dL — ABNORMAL HIGH (ref 6.5–8.1)

## 2015-07-02 LAB — URINALYSIS, ROUTINE W REFLEX MICROSCOPIC
Bilirubin Urine: NEGATIVE
GLUCOSE, UA: NEGATIVE mg/dL
Hgb urine dipstick: NEGATIVE
KETONES UR: NEGATIVE mg/dL
Nitrite: NEGATIVE
PH: 6 (ref 5.0–8.0)
Protein, ur: NEGATIVE mg/dL
SPECIFIC GRAVITY, URINE: 1.015 (ref 1.005–1.030)
Urobilinogen, UA: 0.2 mg/dL (ref 0.0–1.0)

## 2015-07-02 LAB — CBC
HEMATOCRIT: 38 % (ref 36.0–46.0)
Hemoglobin: 12.7 g/dL (ref 12.0–15.0)
MCH: 30 pg (ref 26.0–34.0)
MCHC: 33.4 g/dL (ref 30.0–36.0)
MCV: 89.8 fL (ref 78.0–100.0)
PLATELETS: 264 10*3/uL (ref 150–400)
RBC: 4.23 MIL/uL (ref 3.87–5.11)
RDW: 12.7 % (ref 11.5–15.5)
WBC: 6.8 10*3/uL (ref 4.0–10.5)

## 2015-07-02 LAB — LIPASE, BLOOD: LIPASE: 22 U/L (ref 22–51)

## 2015-07-02 MED ORDER — ACETAMINOPHEN 650 MG RE SUPP: 650.0000 mg | Freq: Four times a day (QID) | RECTAL | Status: DC | PRN

## 2015-07-02 MED ORDER — ACETAMINOPHEN 325 MG PO TABS: 650.0000 mg | ORAL_TABLET | Freq: Four times a day (QID) | ORAL | Status: DC | PRN

## 2015-07-02 MED ORDER — DEXTROSE 5 % IV SOLN
2.0000 g | INTRAVENOUS | Status: DC
  Filled 2015-07-02: qty 2

## 2015-07-02 MED ORDER — SODIUM CHLORIDE 0.9 % IV BOLUS (SEPSIS)
1000.0000 mL | Freq: Once | INTRAVENOUS | Status: AC
  Administered 2015-07-02: 1000 mL via INTRAVENOUS

## 2015-07-02 MED ORDER — HYDROCODONE-ACETAMINOPHEN 5-325 MG PO TABS
1.0000 | ORAL_TABLET | ORAL | Status: DC | PRN
  Administered 2015-07-02 (×2): 1 via ORAL
  Administered 2015-07-03 – 2015-07-04 (×6): 2 via ORAL
  Filled 2015-07-02 (×2): qty 2
  Filled 2015-07-02 (×2): qty 1
  Filled 2015-07-02 (×2): qty 2
  Filled 2015-07-02 (×2): qty 1
  Filled 2015-07-02: qty 2

## 2015-07-02 MED ORDER — ONDANSETRON HCL 4 MG/2ML IJ SOLN
4.0000 mg | Freq: Four times a day (QID) | INTRAMUSCULAR | Status: DC | PRN
  Administered 2015-07-02: 4 mg via INTRAVENOUS
  Filled 2015-07-02: qty 2

## 2015-07-02 MED ORDER — IBUPROFEN 200 MG PO TABS
600.0000 mg | ORAL_TABLET | Freq: Four times a day (QID) | ORAL | Status: DC | PRN
  Administered 2015-07-03: 600 mg via ORAL
  Filled 2015-07-02 (×2): qty 3

## 2015-07-02 MED ORDER — ONDANSETRON 4 MG PO TBDP: 4.0000 mg | ORAL_TABLET | Freq: Four times a day (QID) | ORAL | Status: DC | PRN

## 2015-07-02 MED ORDER — MORPHINE SULFATE (PF) 2 MG/ML IV SOLN
2.0000 mg | INTRAVENOUS | Status: DC | PRN
  Administered 2015-07-02: 2 mg via INTRAVENOUS
  Filled 2015-07-02: qty 1

## 2015-07-02 MED ORDER — DEXTROSE 5 % IV SOLN
2.0000 g | INTRAVENOUS | Status: DC
  Administered 2015-07-02: 2 g via INTRAVENOUS
  Filled 2015-07-02 (×2): qty 2

## 2015-07-02 MED ORDER — POTASSIUM CHLORIDE IN NACL 20-0.9 MEQ/L-% IV SOLN
INTRAVENOUS | Status: DC
  Administered 2015-07-02: 19:00:00 via INTRAVENOUS
  Administered 2015-07-03: 100 mL/h via INTRAVENOUS
  Administered 2015-07-03: 16:00:00 via INTRAVENOUS
  Administered 2015-07-04: 100 mL/h via INTRAVENOUS
  Filled 2015-07-02 (×8): qty 1000

## 2015-07-02 NOTE — ED Notes (Signed)
Report called to Kenney Houseman, RN [address].  Patient to be transferred to room 1605.

## 2015-07-02 NOTE — ED Notes (Signed)
Dr. Rhunette Croft notified of patients temp (102.4)

## 2015-07-02 NOTE — ED Notes (Addendum)
Pt stated that she was told to come here by her doctor.  Pt states that she has a GI doctor in [place] and been suspecting gallbladder issues.  Pt states that she having RUQ pain with nausea and inconsistent diarrhea.  Pt states that she has been having fever 101 been taking Tylenol.

## 2015-07-02 NOTE — ED Provider Notes (Signed)
CSN: [id number]     Arrival date & time 07/02/15  1044 History   First MD Initiated Contact with Patient 07/02/15 1135     Chief Complaint  Patient presents with  . Abdominal Pain  . Nausea     (Consider location/radiation/quality/duration/timing/severity/associated sxs/prior Treatment) Patient is a 24 y.o. female presenting with abdominal pain. The history is provided by the patient.  Abdominal Pain Pain location:  RUQ Pain quality: sharp   Pain radiates to:  Back Pain severity:  Severe Onset quality:  Sudden Duration:  3 hours Timing:  Constant Progression:  Worsening Chronicity:  Recurrent Context: not eating, not previous surgeries, not suspicious food intake and not trauma   Associated symptoms: anorexia, fever and nausea   Associated symptoms: no chest pain, no constipation, no cough, no diarrhea, no dysuria, no hematuria, no shortness of breath, no vaginal discharge and no vomiting     Past Medical History  Diagnosis Date  . Depression   . Anxiety   . Mood disorder   . Obsessive-compulsive disorder   . Meningitis due to viruses 2014    [facility] hospitalized  . Trichotillomania    Past Surgical History  Procedure Laterality Date  . Mouth surgery     Family History  Problem Relation Age of Onset  . Alcohol abuse Mother   . Anxiety disorder Mother   . Depression Mother   . Anxiety disorder Father   . Depression Father   . OCD Father   . Paranoid behavior Father   . Bipolar disorder Father   . ADD / ADHD Sister   . Anxiety disorder Cousin   . Depression Cousin    Social History  Substance Use Topics  . Smoking status: Never Smoker   . Smokeless tobacco: Never Used  . Alcohol Use: Yes     Comment: Occassional Use   OB History    No data available     Review of Systems  Constitutional: Positive for fever and activity change.  HENT: Negative for facial swelling.   Respiratory: Negative for cough, shortness of breath and wheezing.   Cardiovascular:  Negative for chest pain.  Gastrointestinal: Positive for nausea, abdominal pain and anorexia. Negative for vomiting, diarrhea, constipation, blood in stool and abdominal distention.  Genitourinary: Negative for dysuria, hematuria, vaginal discharge and difficulty urinating.  Musculoskeletal: Negative for neck pain.  Skin: Negative for color change.  Neurological: Negative for speech difficulty.  Hematological: Does not bruise/bleed easily.  Psychiatric/Behavioral: Negative for confusion.  All other systems reviewed and are negative.     Allergies  Review of patient's allergies indicates no known allergies.  Home Medications   Prior to Admission medications   Medication Sig Start Date End Date Taking? Authorizing Provider  acetaminophen (TYLENOL) 500 MG tablet Take 1,000 mg by mouth every 6 (six) hours as needed for mild pain, moderate pain or headache.   Yes Historical Provider, MD  FLUoxetine (PROZAC) 20 MG capsule Take 60 mg by mouth daily.   Yes Historical Provider, MD  Multiple Vitamin (MULITIVITAMIN WITH MINERALS) TABS Take 1 tablet by mouth daily.   Yes Historical Provider, MD  norethindrone-ethinyl estradiol (JUNEL FE,GILDESS FE,LOESTRIN FE) 1-20 MG-MCG tablet Take 1 tablet by mouth.   Yes Historical Provider, MD  pantoprazole (PROTONIX) 40 MG tablet Take 40 mg by mouth daily.  08/31/14  Yes Historical Provider, MD  ranitidine (ZANTAC) 150 MG capsule Take 150 mg by mouth at bedtime.   Yes Historical Provider, MD  escitalopram (LEXAPRO) 20 MG  tablet Take 1 tablet (20 mg total) by mouth daily. Patient not taking: Reported on 07/02/2015 09/11/14   Tamala Julian, PA-C  pantoprazole (PROTONIX) 20 MG tablet Take 1 tablet (20 mg total) by mouth once. Patient not taking: Reported on 07/02/2015 04/09/12 07/02/15  Cherrie Distance, PA-C   BP 109/64 mmHg  Pulse 103  Temp(Src) 101.2 F (38.4 C) (Oral)  Resp 18  SpO2 98%  LMP 06/02/2015 Physical Exam  Constitutional: She is oriented to  person, place, and time. She appears well-developed and well-nourished.  HENT:  Head: Normocephalic and atraumatic.  Eyes: EOM are normal. Pupils are equal, round, and reactive to light.  Neck: Neck supple.  Cardiovascular: Normal rate, regular rhythm and normal heart sounds.   No murmur heard. Pulmonary/Chest: Effort normal. No respiratory distress.  Abdominal: Soft. She exhibits no distension. There is tenderness. There is guarding. There is no rebound.  RUQ tenderness, neg murphy's  Neurological: She is alert and oriented to person, place, and time.  Skin: Skin is warm and dry.  Nursing note and vitals reviewed.   ED Course  Procedures (including critical care time) Labs Review Labs Reviewed  COMPREHENSIVE METABOLIC PANEL - Abnormal; Notable for the following:    Total Protein 8.2 (*)    All other components within normal limits  URINALYSIS, ROUTINE W REFLEX MICROSCOPIC (NOT AT [facility] - Abnormal; Notable for the following:    APPearance CLOUDY (*)    Leukocytes, UA SMALL (*)    All other components within normal limits  URINE MICROSCOPIC-ADD ON - Abnormal; Notable for the following:    Squamous Epithelial / LPF FEW (*)    Bacteria, UA FEW (*)    All other components within normal limits  LIPASE, BLOOD  CBC    Imaging Review Dg Chest 2 View  07/02/2015   CLINICAL DATA:  Right upper quadrant pain  EXAM: CHEST  2 VIEW  COMPARISON:  04/08/2012  FINDINGS: The heart size and mediastinal contours are within normal limits. Both lungs are clear. The visualized skeletal structures are unremarkable.  IMPRESSION: No active cardiopulmonary disease.   Electronically Signed   By: Natasha Mead M.D.   On: 07/02/2015 15:52   US Abdomen Limited Ruq  07/02/2015   CLINICAL DATA:  Right upper quadrant pain for 1 day  EXAM: US ABDOMEN LIMITED - RIGHT UPPER QUADRANT  COMPARISON:  None.  FINDINGS: Gallbladder:  Well distended with multiple gallstones. No wall thickening or pericholecystic fluid is  noted. A negative sonographic Eulah Pont sign is seen.  Common bile duct:  Diameter: 3.3 mm.  Liver:  No focal lesion identified. Within normal limits in parenchymal echogenicity.  IMPRESSION: Multiple gallstones without complicating factors.   Electronically Signed   By: Alcide Clever M.D.   On: 07/02/2015 13:25      EKG Interpretation None      MDM   Final diagnoses:  Symptomatic cholelithiasis  Abdominal pain, acute, right upper quadrant    DDx includes: Pancreatitis Hepatobiliary pathology including cholecystitis Gastritis/PUD SBO ACS syndrome Aortic Dissection  Pt comes in with cc of abdominal pain. She has had right sided abd pain for few days now, and is suspected to have gall stones. Symptoms got worse today. On exam - pt has RUQ tenderness. She also has a fever, which started yday. ROS not indicating any other source of infection at this time.  Surgery consulted for concerns of symptomatic cholelithiasis - and they will admit.  Derwood Kaplan, MD 07/02/15 [phone]

## 2015-07-02 NOTE — Progress Notes (Signed)
Pt confirms pcp as carol webb

## 2015-07-02 NOTE — H&P (Signed)
Chief Complaint: RUQ abdominal pain HPI: Peggy Mcdonald is a 24 year old female with a history of IBS, esophagitis and anxiety/depression presenting with sudden onset RUQ abdominal pain.  Stated this AM at 0600.  Denies previous symptoms, however, she was seen at an urgent care(for a rash) and by her GI doctor in [place] who noted she was tender to RUQ and referred for an [place] and labs.  She reports fevers which started yesterday of 101.  Denies post prandial symptoms.  Associated with nausea, some diarrhea, but she wasn't sure if it was related to her IBS.  Modifying factors include; tylenol.  Aggravated by movement.  No alleviating factors.  She rates her pain 8/10 presently.  Her work up shows normal LFTs, normal WBC.  Abdominal US significant for cholelithiasis.  We have therefore been asked to evaluate.    UA is negative.  Denies a productive cough, no shortness of breath other than pain with inspiration to RUQ.  NPO since 0830. Does not use blood thinners.  Denies history of IBD. Accompanied by mother.   Past Medical History  Diagnosis Date  . Depression   . Anxiety   . Mood disorder   . Obsessive-compulsive disorder   . Meningitis due to viruses 2014    [facility] hospitalized  . Trichotillomania     Past Surgical History  Procedure Laterality Date  . Mouth surgery      Family History  Problem Relation Age of Onset  . Alcohol abuse Mother   . Anxiety disorder Mother   . Depression Mother   . Anxiety disorder Father   . Depression Father   . OCD Father   . Paranoid behavior Father   . Bipolar disorder Father   . ADD / ADHD Sister   . Anxiety disorder Cousin   . Depression Cousin    Social History:  reports that she has never smoked. She has never used smokeless tobacco. She reports that she drinks alcohol. She reports that she uses illicit drugs.  Allergies: No Known Allergies  Medication: Prior to Admission medications   Medication Sig Start Date End Date Taking?  Authorizing Provider  acetaminophen (TYLENOL) 500 MG tablet Take 1,000 mg by mouth every 6 (six) hours as needed for mild pain, moderate pain or headache.   Yes Historical Provider, MD  FLUoxetine (PROZAC) 20 MG capsule Take 60 mg by mouth daily.   Yes Historical Provider, MD  Multiple Vitamin (MULITIVITAMIN WITH MINERALS) TABS Take 1 tablet by mouth daily.   Yes Historical Provider, MD  norethindrone-ethinyl estradiol (JUNEL FE,GILDESS FE,LOESTRIN FE) 1-20 MG-MCG tablet Take 1 tablet by mouth.   Yes Historical Provider, MD  pantoprazole (PROTONIX) 40 MG tablet Take 40 mg by mouth daily.  08/31/14  Yes Historical Provider, MD  ranitidine (ZANTAC) 150 MG capsule Take 150 mg by mouth at bedtime.   Yes Historical Provider, MD  escitalopram (LEXAPRO) 20 MG tablet Take 1 tablet (20 mg total) by mouth daily. Patient not taking: Reported on 07/02/2015 09/11/14   Ruben Im, PA-C  pantoprazole (PROTONIX) 20 MG tablet Take 1 tablet (20 mg total) by mouth once. Patient not taking: Reported on 07/02/2015 04/09/12 07/02/15  Ignacia Felling, PA-C     (Not in a hospital admission)  Results for orders placed or performed during the hospital encounter of 07/02/15 (from the past 48 hour(s))  Lipase, blood     Status: None   Collection Time: 07/02/15 11:23 AM  Result Value Ref Range   Lipase  22 22 - 51 U/L  Comprehensive metabolic panel     Status: Abnormal   Collection Time: 07/02/15 11:23 AM  Result Value Ref Range   Sodium 135 135 - 145 mmol/L   Potassium 4.0 3.5 - 5.1 mmol/L   Chloride 103 101 - 111 mmol/L   CO2 24 22 - 32 mmol/L   Glucose, Bld 93 65 - 99 mg/dL   BUN 10 6 - 20 mg/dL   Creatinine, Ser 0.79 0.44 - 1.00 mg/dL   Calcium 9.7 8.9 - 10.3 mg/dL   Total Protein 8.2 (H) 6.5 - 8.1 g/dL   Albumin 4.3 3.5 - 5.0 g/dL   AST 24 15 - 41 U/L   ALT 23 14 - 54 U/L   Alkaline Phosphatase 64 38 - 126 U/L   Total Bilirubin 0.6 0.3 - 1.2 mg/dL   GFR calc non Af Amer >60 >60 mL/min   GFR calc Af Amer  >60 >60 mL/min    Comment: (NOTE) The eGFR has been calculated using the CKD EPI equation. This calculation has not been validated in all clinical situations. eGFR's persistently <60 mL/min signify possible Chronic Kidney Disease.    Anion gap 8 5 - 15  CBC     Status: None   Collection Time: 07/02/15 11:23 AM  Result Value Ref Range   WBC 6.8 4.0 - 10.5 K/uL   RBC 4.23 3.87 - 5.11 MIL/uL   Hemoglobin 12.7 12.0 - 15.0 g/dL   HCT 38.0 36.0 - 46.0 %   MCV 89.8 78.0 - 100.0 fL   MCH 30.0 26.0 - 34.0 pg   MCHC 33.4 30.0 - 36.0 g/dL   RDW 12.7 11.5 - 15.5 %   Platelets 264 150 - 400 K/uL  Urinalysis, Routine w reflex microscopic (not at [facility])     Status: Abnormal   Collection Time: 07/02/15  1:49 PM  Result Value Ref Range   Color, Urine YELLOW YELLOW   APPearance CLOUDY (A) CLEAR   Specific Gravity, Urine 1.015 1.005 - 1.030   pH 6.0 5.0 - 8.0   Glucose, UA NEGATIVE NEGATIVE mg/dL   Hgb urine dipstick NEGATIVE NEGATIVE   Bilirubin Urine NEGATIVE NEGATIVE   Ketones, ur NEGATIVE NEGATIVE mg/dL   Protein, ur NEGATIVE NEGATIVE mg/dL   Urobilinogen, UA 0.2 0.0 - 1.0 mg/dL   Nitrite NEGATIVE NEGATIVE   Leukocytes, UA SMALL (A) NEGATIVE  Urine microscopic-add on     Status: Abnormal   Collection Time: 07/02/15  1:49 PM  Result Value Ref Range   Squamous Epithelial / LPF FEW (A) RARE   WBC, UA 3-6 <3 WBC/hpf   RBC / HPF 0-2 <3 RBC/hpf   Bacteria, UA FEW (A) RARE   Urine-Other RARE YEAST    US Abdomen Limited Ruq  07/02/2015   CLINICAL DATA:  Right upper quadrant pain for 1 day  EXAM: US ABDOMEN LIMITED - RIGHT UPPER QUADRANT  COMPARISON:  None.  FINDINGS: Gallbladder:  Well distended with multiple gallstones. No wall thickening or pericholecystic fluid is noted. A negative sonographic Percell Miller sign is seen.  Common bile duct:  Diameter: 3.3 mm.  Liver:  No focal lesion identified. Within normal limits in parenchymal echogenicity.  IMPRESSION: Multiple gallstones without complicating  factors.   Electronically Signed   By: Inez Catalina M.D.   On: 07/02/2015 13:25    Review of Systems  All other systems reviewed and are negative.   Blood pressure 104/58, pulse 104, temperature 102.4 F (39.1 C), temperature  source Oral, resp. rate 16, last menstrual period 06/02/2015, SpO2 99 %. Physical Exam  Constitutional: She is oriented to person, place, and time. She appears well-developed and well-nourished. No distress.  Cardiovascular: Normal rate, regular rhythm, normal heart sounds and intact distal pulses.  Exam reveals no gallop and no friction rub.   No murmur heard. Respiratory: Effort normal and breath sounds normal. No respiratory distress. She has no wheezes. She has no rales. She exhibits no tenderness.  GI: Soft. Bowel sounds are normal. She exhibits no distension and no mass. There is no rebound and no guarding.  Focally tender to RUQ without guarding.    Musculoskeletal: Normal range of motion. She exhibits no edema or tenderness.  Neurological: She is alert and oriented to person, place, and time.  Skin: Skin is warm and dry. No rash noted. She is not diaphoretic. No erythema. No pallor.  Psychiatric: She has a normal mood and affect. Her behavior is normal. Judgment and thought content normal.     Assessment/Plan Symptomatic cholelithiasis with early cholecystitis-laparoscopic cholecystectomy, timing to be determined.  Will leave her NPO for now. Psych-home meds post op VTE prophylaxis-SCD FEN-IVF, pain control.  She would like to avoid narcotics. Dispo-admit  Pearley Millington ANP-BC 07/02/2015, 3:07 PM

## 2015-07-02 NOTE — ED Notes (Signed)
Bed: WA07 Expected date:  Expected time:  Means of arrival:  Comments: 

## 2015-07-02 NOTE — ED Notes (Signed)
Surgery PA at bedside.  

## 2015-07-02 NOTE — ED Notes (Signed)
Patient will notify staff when able to provide urine sample. 

## 2015-07-02 NOTE — Progress Notes (Signed)
Dr Rhunette Croft in with pt when cm visited

## 2015-07-02 NOTE — ED Notes (Addendum)
Pt fearful of iv sticks.  She would like to wait until after ultrasound to start iv.   MD notified.

## 2015-07-03 ENCOUNTER — Encounter (HOSPITAL_COMMUNITY)
Admission: EM | Disposition: A | Discharge status: Home / Self Care | Payer: Self-pay | Source: Home / Self Care | Attending: Emergency Medicine

## 2015-07-03 ENCOUNTER — Observation Stay (HOSPITAL_COMMUNITY): Payer: BLUE CROSS/BLUE SHIELD | Admitting: Anesthesiology

## 2015-07-03 ENCOUNTER — Observation Stay (HOSPITAL_COMMUNITY): Payer: BLUE CROSS/BLUE SHIELD

## 2015-07-03 ENCOUNTER — Encounter (HOSPITAL_COMMUNITY): Payer: Self-pay | Admitting: Anesthesiology

## 2015-07-03 HISTORY — PX: CHOLECYSTECTOMY: SHX55

## 2015-07-03 LAB — SURGICAL PCR SCREEN
MRSA, PCR: NEGATIVE
Staphylococcus aureus: NEGATIVE

## 2015-07-03 LAB — PREGNANCY, URINE: Preg Test, Ur: NEGATIVE

## 2015-07-03 SURGERY — LAPAROSCOPIC CHOLECYSTECTOMY WITH INTRAOPERATIVE CHOLANGIOGRAM
Anesthesia: General | Site: Abdomen

## 2015-07-03 MED ORDER — FENTANYL CITRATE (PF) 250 MCG/5ML IJ SOLN
INTRAMUSCULAR | Status: AC
  Filled 2015-07-03: qty 25

## 2015-07-03 MED ORDER — LACTATED RINGERS IV SOLN
INTRAVENOUS | Status: DC | PRN
  Administered 2015-07-03 (×2): via INTRAVENOUS

## 2015-07-03 MED ORDER — GLYCOPYRROLATE 0.2 MG/ML IJ SOLN
INTRAMUSCULAR | Status: AC
  Filled 2015-07-03: qty 1

## 2015-07-03 MED ORDER — FAMOTIDINE 20 MG PO TABS
20.0000 mg | ORAL_TABLET | Freq: Every day | ORAL | Status: DC
  Administered 2015-07-03: 20 mg via ORAL
  Filled 2015-07-03 (×2): qty 1

## 2015-07-03 MED ORDER — MEPERIDINE HCL 25 MG/ML IJ SOLN: 6.2500 mg | INTRAMUSCULAR | Status: DC | PRN

## 2015-07-03 MED ORDER — GLYCOPYRROLATE 0.2 MG/ML IJ SOLN
INTRAMUSCULAR | Status: DC | PRN
  Administered 2015-07-03: 0.6 mg via INTRAVENOUS

## 2015-07-03 MED ORDER — PROPOFOL 10 MG/ML IV BOLUS
INTRAVENOUS | Status: AC
  Filled 2015-07-03: qty 20

## 2015-07-03 MED ORDER — MIDAZOLAM HCL 2 MG/2ML IJ SOLN
INTRAMUSCULAR | Status: AC
  Filled 2015-07-03: qty 4

## 2015-07-03 MED ORDER — PROMETHAZINE HCL 25 MG/ML IJ SOLN: 6.2500 mg | INTRAMUSCULAR | Status: DC | PRN

## 2015-07-03 MED ORDER — PROPOFOL 10 MG/ML IV BOLUS
INTRAVENOUS | Status: DC | PRN
  Administered 2015-07-03: 180 mg via INTRAVENOUS

## 2015-07-03 MED ORDER — BUPIVACAINE-EPINEPHRINE (PF) 0.25% -1:200000 IJ SOLN
INTRAMUSCULAR | Status: AC
  Filled 2015-07-03: qty 30

## 2015-07-03 MED ORDER — SCOPOLAMINE 1 MG/3DAYS TD PT72
MEDICATED_PATCH | TRANSDERMAL | Status: AC
  Filled 2015-07-03: qty 1

## 2015-07-03 MED ORDER — IOHEXOL 300 MG/ML  SOLN
INTRAMUSCULAR | Status: DC | PRN
  Administered 2015-07-03: 3 mL

## 2015-07-03 MED ORDER — KETOROLAC TROMETHAMINE 30 MG/ML IJ SOLN
INTRAMUSCULAR | Status: AC
  Filled 2015-07-03: qty 1

## 2015-07-03 MED ORDER — FLUOXETINE HCL 20 MG PO CAPS
60.0000 mg | ORAL_CAPSULE | Freq: Every day | ORAL | Status: DC
  Administered 2015-07-03: 60 mg via ORAL
  Filled 2015-07-03 (×2): qty 3

## 2015-07-03 MED ORDER — ONDANSETRON HCL 4 MG/2ML IJ SOLN
INTRAMUSCULAR | Status: AC
  Filled 2015-07-03: qty 2

## 2015-07-03 MED ORDER — EPHEDRINE SULFATE 50 MG/ML IJ SOLN
INTRAMUSCULAR | Status: AC
  Filled 2015-07-03: qty 1

## 2015-07-03 MED ORDER — LACTATED RINGERS IV SOLN: INTRAVENOUS | Status: DC

## 2015-07-03 MED ORDER — KETOROLAC TROMETHAMINE 30 MG/ML IJ SOLN
INTRAMUSCULAR | Status: DC | PRN
  Administered 2015-07-03: 30 mg via INTRAVENOUS

## 2015-07-03 MED ORDER — SCOPOLAMINE 1 MG/3DAYS TD PT72
MEDICATED_PATCH | TRANSDERMAL | Status: DC | PRN
  Administered 2015-07-03: 1 via TRANSDERMAL

## 2015-07-03 MED ORDER — SODIUM CHLORIDE 0.9 % IJ SOLN
INTRAMUSCULAR | Status: AC
Start: 2015-07-03 — End: 2015-07-03
  Filled 2015-07-03: qty 10

## 2015-07-03 MED ORDER — FLUOXETINE HCL 20 MG PO CAPS: 60.0000 mg | ORAL_CAPSULE | Freq: Every day | ORAL | Status: DC

## 2015-07-03 MED ORDER — ROCURONIUM BROMIDE 100 MG/10ML IV SOLN
INTRAVENOUS | Status: DC | PRN
  Administered 2015-07-03: 25 mg via INTRAVENOUS
  Administered 2015-07-03: 5 mg via INTRAVENOUS

## 2015-07-03 MED ORDER — BUPIVACAINE-EPINEPHRINE (PF) 0.25% -1:200000 IJ SOLN
INTRAMUSCULAR | Status: DC | PRN
  Administered 2015-07-03: 30 mL

## 2015-07-03 MED ORDER — MIDAZOLAM HCL 5 MG/5ML IJ SOLN
INTRAMUSCULAR | Status: DC | PRN
  Administered 2015-07-03: 2 mg via INTRAVENOUS

## 2015-07-03 MED ORDER — LACTATED RINGERS IR SOLN
Status: DC | PRN
  Administered 2015-07-03: 1

## 2015-07-03 MED ORDER — 0.9 % SODIUM CHLORIDE (POUR BTL) OPTIME
TOPICAL | Status: DC | PRN
  Administered 2015-07-03: 1000 mL

## 2015-07-03 MED ORDER — LIDOCAINE HCL (CARDIAC) 20 MG/ML IV SOLN
INTRAVENOUS | Status: AC
  Filled 2015-07-03: qty 5

## 2015-07-03 MED ORDER — NEOSTIGMINE METHYLSULFATE 10 MG/10ML IV SOLN
INTRAVENOUS | Status: DC | PRN
  Administered 2015-07-03: 4 mg via INTRAVENOUS

## 2015-07-03 MED ORDER — LIDOCAINE HCL (CARDIAC) 20 MG/ML IV SOLN
INTRAVENOUS | Status: DC | PRN
  Administered 2015-07-03: 50 mg via INTRAVENOUS

## 2015-07-03 MED ORDER — FENTANYL CITRATE (PF) 100 MCG/2ML IJ SOLN
INTRAMUSCULAR | Status: DC | PRN
  Administered 2015-07-03 (×2): 50 ug via INTRAVENOUS
  Administered 2015-07-03: 100 ug via INTRAVENOUS
  Administered 2015-07-03: 50 ug via INTRAVENOUS

## 2015-07-03 MED ORDER — MORPHINE SULFATE (PF) 2 MG/ML IV SOLN
1.0000 mg | INTRAVENOUS | Status: DC | PRN
  Administered 2015-07-03 (×3): 2 mg via INTRAVENOUS
  Filled 2015-07-03 (×3): qty 1

## 2015-07-03 MED ORDER — DEXAMETHASONE SODIUM PHOSPHATE 10 MG/ML IJ SOLN
INTRAMUSCULAR | Status: AC
  Filled 2015-07-03: qty 1

## 2015-07-03 MED ORDER — SUCCINYLCHOLINE CHLORIDE 20 MG/ML IJ SOLN
INTRAMUSCULAR | Status: DC | PRN
  Administered 2015-07-03: 100 mg via INTRAVENOUS

## 2015-07-03 MED ORDER — DEXAMETHASONE SODIUM PHOSPHATE 10 MG/ML IJ SOLN
INTRAMUSCULAR | Status: DC | PRN
  Administered 2015-07-03: 10 mg via INTRAVENOUS

## 2015-07-03 MED ORDER — HYDROMORPHONE HCL 1 MG/ML IJ SOLN: 0.2500 mg | INTRAMUSCULAR | Status: DC | PRN

## 2015-07-03 SURGICAL SUPPLY — 32 items
APPLIER CLIP LOGIC TI 5 (MISCELLANEOUS) ×3 IMPLANT
APPLIER CLIP ROT 10 11.4 M/L (STAPLE) ×3
BENZOIN TINCTURE PRP APPL 2/3 (GAUZE/BANDAGES/DRESSINGS) IMPLANT
CHLORAPREP W/TINT 26ML (MISCELLANEOUS) ×3 IMPLANT
CHOLANGIOGRAM CATH TAUT (CATHETERS) ×3 IMPLANT
CLIP APPLIE ROT 10 11.4 M/L (STAPLE) ×1 IMPLANT
CLOSURE WOUND 1/4X4 (GAUZE/BANDAGES/DRESSINGS) ×1
COVER MAYO STAND STRL (DRAPES) ×3 IMPLANT
COVER SURGICAL LIGHT HANDLE (MISCELLANEOUS) ×3 IMPLANT
DECANTER SPIKE VIAL GLASS SM (MISCELLANEOUS) ×3 IMPLANT
DRAPE C-ARM 42X120 X-RAY (DRAPES) ×3 IMPLANT
DRAPE LAPAROSCOPIC ABDOMINAL (DRAPES) ×3 IMPLANT
ELECT REM PT RETURN 9FT ADLT (ELECTROSURGICAL) ×3
ELECTRODE REM PT RTRN 9FT ADLT (ELECTROSURGICAL) ×1 IMPLANT
GLOVE SURG SIGNA 7.5 PF LTX (GLOVE) ×3 IMPLANT
GOWN STRL REUS W/TWL XL LVL3 (GOWN DISPOSABLE) ×9 IMPLANT
HEMOSTAT SURGICEL 4X8 (HEMOSTASIS) IMPLANT
IV CATH 14GX2 1/4 (CATHETERS) ×3 IMPLANT
IV SET EXTENSION CATH 6 NF (IV SETS) ×3 IMPLANT
KIT BASIN OR (CUSTOM PROCEDURE TRAY) ×3 IMPLANT
LIQUID BAND (GAUZE/BANDAGES/DRESSINGS) ×3 IMPLANT
POUCH SPECIMEN RETRIEVAL 10MM (ENDOMECHANICALS) ×3 IMPLANT
SET IRRIG TUBING LAPAROSCOPIC (IRRIGATION / IRRIGATOR) ×3 IMPLANT
SLEEVE XCEL OPT CAN 5 100 (ENDOMECHANICALS) ×3 IMPLANT
STOPCOCK 4 WAY LG BORE MALE ST (IV SETS) ×3 IMPLANT
STRIP CLOSURE SKIN 1/4X4 (GAUZE/BANDAGES/DRESSINGS) ×2 IMPLANT
SUT VIC AB 5-0 PS2 18 (SUTURE) ×3 IMPLANT
TOWEL OR 17X26 10 PK STRL BLUE (TOWEL DISPOSABLE) ×3 IMPLANT
TRAY LAPAROSCOPIC (CUSTOM PROCEDURE TRAY) ×3 IMPLANT
TROCAR BLADELESS OPT 5 100 (ENDOMECHANICALS) ×3 IMPLANT
TROCAR XCEL BLUNT TIP 100MML (ENDOMECHANICALS) ×3 IMPLANT
TROCAR XCEL NON-BLD 11X100MML (ENDOMECHANICALS) ×3 IMPLANT

## 2015-07-03 NOTE — Op Note (Signed)
07/02/2015 - 07/03/2015  12:28 PM  PATIENT:  Peggy Mcdonald, 24 y.o., female, MRN: [id number]  PREOP DIAGNOSIS:  cholecystitis  POSTOP DIAGNOSIS:   Mild cholecystitis, cholelithiasis  PROCEDURE:   Procedure(s): LAPAROSCOPIC CHOLECYSTECTOMY WITH INTRAOPERATIVE CHOLANGIOGRAM  SURGEON:   Ovidio Kin, M.D.  ASSISTANTGordy Savers, MD  ANESTHESIA:   general  Anesthesiologist: Mal Amabile, MD CRNA: Atilano Median, CRNA  General  ASA: 2  EBL:  minimal  ml  BLOOD ADMINISTERED: none  DRAINS: none   LOCAL MEDICATIONS USED:   30 cc 1/4% marcaine  SPECIMEN:   Gall bladder  COUNTS CORRECT:  YES  INDICATIONS FOR PROCEDURE:  Peggy Mcdonald is a 24 y.o. (DOB: 05/18/1991) white  female whose primary care physician is WEBB, CAROL D, MD and comes for cholecystectomy.   The indications and risks of the gall bladder surgery were explained to the patient.  The risks include, but are not limited to, infection, bleeding, common bile duct injury and open surgery.  SURGERY:  The patient was taken to room #[facility] at [facility].  The abdomen was prepped with chloroprep.  The patient was on Rocephin prior to the beginning of the operation.   A time out was held and the surgical checklist run.   An infraumbilical incision was made into the abdominal cavity.  A 12 mm Hasson trocar was inserted into the abdominal cavity through the infraumbilical incision and secured with a 0 Vicryl suture.  Three additional trocars were inserted: a 5 mm trocar in the sub-xiphoid location, a 5 mm trocar in the right mid subcostal area, and a 5 mm trocar in the right lateral subcostal area.   The abdomen was explored and the liver, stomach, and bowel that could be seen were unremarkable.   The gall bladder was identified, grasped, and rotated cephalad.  Disssection was carried down to the gall bladder/cystic duct junction and the cystic duct isolated.  A clip was placed on the gall bladder side of the cystic duct.    An intra-operative cholangiogram was shot.  The patient had a very long and small cystic duct.   The intra-operative cholangiogram was shot using a cut off Taut catheter placed through a 14 gauge angiocath in the RUQ.  The Taut catheter was inserted in the cut cystic duct and secured with an endoclip.  A cholangiogram was shot with 10 cc of 1/2 strength Omnipaque.  Using fluoroscopy, the cholangiogram showed the flow of contrast into the common bile duct, up the hepatic radicals, and into the duodenum.  There was no mass or obstruction.  This was a normal intra-operative cholangiogram.   The Taut catheter was removed.  The cystic duct was tripley endoclipped and the cystic artery was identified and clipped.  The gall bladder was bluntly and sharpley dissected from the gall bladder bed.   After the gall bladder was removed from the liver, the gall bladder bed and Tri[place] of [place] were inspected.  There was no bleeding or bile leak.  The gall bladder was placed in a endocatch bag and delivered through the umbilicus.  The abdomen was irrigated with 800 cc saline.   The trocars were then removed.  I infiltrated 30 cc of 1/4% Marcaine into the incisions.  The umbilical port closed with a 0 Vicryl suture and the skin closed with 5-0 Monocryl.  The skin was painted with LiquidBand.  The patient's sponge and needle count were correct.  The patient was transported to the RR in good  condition.  Ovidio Kinavid Teesha Ohm, MD, [facility] Pager: [phone] Office phone:  [phone]

## 2015-07-03 NOTE — Anesthesia Preprocedure Evaluation (Signed)
Anesthesia Evaluation  Patient identified by MRN, date of birth, ID band Patient awake    Reviewed: Allergy & Precautions, NPO status , Patient's Chart, lab work & pertinent test results  Airway Mallampati: II  TM Distance: >3 FB Neck ROM: Full    Dental no notable dental hx. (+) Teeth Intact   Pulmonary neg pulmonary ROS,  breath sounds clear to auscultation  Pulmonary exam normal       Cardiovascular negative cardio ROS Normal cardiovascular examRhythm:Regular Rate:Normal     Neuro/Psych PSYCHIATRIC DISORDERS Anxiety Depression OCDnegative neurological ROS     GI/Hepatic Neg liver ROS, Cholelithiasis with cholecystitis   Endo/Other  Obesity  Renal/GU negative Renal ROS  negative genitourinary   Musculoskeletal negative musculoskeletal ROS (+)   Abdominal (+) + obese,   Peds  Hematology negative hematology ROS (+)   Anesthesia Other Findings   Reproductive/Obstetrics negative OB ROS                             Anesthesia Physical Anesthesia Plan  ASA: II  Anesthesia Plan: General   Post-op Pain Management:    Induction: Intravenous, Rapid sequence and Cricoid pressure planned  Airway Management Planned: Oral ETT  Additional Equipment:   Intra-op Plan:   Post-operative Plan: Extubation in OR  Informed Consent: I have reviewed the patients History and Physical, chart, labs and discussed the procedure including the risks, benefits and alternatives for the proposed anesthesia with the patient or authorized representative who has indicated his/her understanding and acceptance.   Dental advisory given  Plan Discussed with: CRNA, Anesthesiologist and Surgeon  Anesthesia Plan Comments:         Anesthesia Quick Evaluation

## 2015-07-03 NOTE — Anesthesia Procedure Notes (Signed)
Procedure Name: Intubation Date/Time: 07/03/2015 11:36 AM Performed by: Enriqueta Shutter D Pre-anesthesia Checklist: Patient identified, Emergency Drugs available, Suction available and Patient being monitored Patient Re-evaluated:Patient Re-evaluated prior to inductionOxygen Delivery Method: Circle System Utilized Preoxygenation: Pre-oxygenation with 100% oxygen Intubation Type: IV induction, Cricoid Pressure applied and Rapid sequence Laryngoscope Size: Mac and 3 Grade View: Grade II Tube type: Oral Tube size: 7.5 mm Number of attempts: 1 Airway Equipment and Method: Stylet and Oral airway Placement Confirmation: ETT inserted through vocal cords under direct vision,  positive ETCO2 and breath sounds checked- equal and bilateral Secured at: 21 cm Tube secured with: Tape Dental Injury: Teeth and Oropharynx as per pre-operative assessment

## 2015-07-03 NOTE — Anesthesia Postprocedure Evaluation (Signed)
  Anesthesia Post-op Note  Patient: Peggy Mcdonald  Procedure(s) Performed: Procedure(s): LAPAROSCOPIC CHOLECYSTECTOMY WITH INTRAOPERATIVE CHOLANGIOGRAM (N/A)  Patient Location: PACU  Anesthesia Type:General  Level of Consciousness: awake, alert  and oriented  Airway and Oxygen Therapy: Patient Spontanous Breathing  Post-op Pain: mild  Post-op Assessment: Post-op Vital signs reviewed, Patient's Cardiovascular Status Stable, Respiratory Function Stable, Patent Airway, No signs of Nausea or vomiting and Pain level controlled              Post-op Vital Signs: Reviewed and stable  Last Vitals:  Filed Vitals:   07/03/15 1335  BP: 109/66  Pulse:   Temp: 36.7 C  Resp:     Complications: No apparent anesthesia complications

## 2015-07-03 NOTE — Transfer of Care (Signed)
Immediate Anesthesia Transfer of Care Note  Patient: Peggy Mcdonald  Procedure(s) Performed: Procedure(s): LAPAROSCOPIC CHOLECYSTECTOMY WITH INTRAOPERATIVE CHOLANGIOGRAM (N/A)  Patient Location: PACU  Anesthesia Type:General  Level of Consciousness: awake, alert  and oriented  Airway & Oxygen Therapy: Patient Spontanous Breathing and Patient connected to face mask oxygen  Post-op Assessment: Report given to RN and Post -op Vital signs reviewed and stable  Post vital signs: Reviewed and stable  Last Vitals:  Filed Vitals:   07/03/15 0530  BP: 101/62  Pulse: 73  Temp: 36.7 C  Resp: 18    Complications: No apparent anesthesia complications

## 2015-07-04 ENCOUNTER — Encounter: Payer: Self-pay | Admitting: Surgery

## 2015-07-04 MED ORDER — HYDROCODONE-ACETAMINOPHEN 5-325 MG PO TABS: 1.0000 | ORAL_TABLET | ORAL | Status: AC | PRN

## 2015-07-04 MED ORDER — FLUOXETINE HCL 20 MG PO CAPS
60.0000 mg | ORAL_CAPSULE | Freq: Every day | ORAL | Status: DC
  Administered 2015-07-04: 60 mg via ORAL
  Filled 2015-07-04: qty 3

## 2015-07-04 NOTE — Discharge Instructions (Signed)
[facility] - DISCHARGE INSTRUCTIONS TO PATIENT  Activity:  Driving - May drive in 3 or 4 days if doing well   Lifting - No lifting more than 15 pounds for one week, then no limit  Wound Care:   May shower starting tomorrow.  Diet:  As tolerated.  Drink plenty of liquids.  Follow up appointment:  Call Dr. Allene Pyo office [facility]) at [phone] for an appointment in 2 to 3 weeks.  Medications and dosages:  Resume your home medications.  You have a prescription for:  Vicodin  Call Dr. Ezzard Standing or his office  [phone]) if you have:  Temperature greater than 100.4,  Persistent nausea and vomiting,  Severe uncontrolled pain,  Redness, tenderness, or signs of infection (pain, swelling, redness, odor or green/yellow discharge around the site),  Difficulty breathing, headache or visual disturbances,  Any other questions or concerns you may have after discharge.  In an emergency, call 911 or go to an Emergency Department at a nearby hospital.

## 2015-07-04 NOTE — Discharge Summary (Signed)
Physician Discharge Summary  Patient ID:  Peggy Mcdonald  MRN: [id number]  DOB/AGE: 1991/07/04 24 y.o.  Admit date: 07/02/2015 Discharge date: 07/04/2015  Discharge Diagnoses:   Principal Problem:   Acute calculous cholecystitis s/p lap chole 07/03/2015   Operation: Procedure(s):  LAPAROSCOPIC CHOLECYSTECTOMY WITH INTRAOPERATIVE CHOLANGIOGRAM on 07/03/2015 - D. Ezzard Standing  Discharged Condition: good  Hospital Course: Peggy Mcdonald is an 24 y.o. female whose primary care physician is WEBB, Estill Batten, MD and who was admitted 07/02/2015 with a chief complaint of  Chief Complaint  Patient presents with  . Abdominal Pain  . Nausea  .   She was brought to the operating room on 07/03/2015 and underwent  LAPAROSCOPIC CHOLECYSTECTOMY WITH INTRAOPERATIVE CHOLANGIOGRAM.   She is sore, but kind of ready to go home.  Her mother is in the room with her.  The discharge instructions were reviewed with the patient.  Consults: None  Significant Diagnostic Studies: Results for orders placed or performed during the hospital encounter of 07/02/15  Surgical pcr screen  Result Value Ref Range   MRSA, PCR NEGATIVE NEGATIVE   Staphylococcus aureus NEGATIVE NEGATIVE  Lipase, blood  Result Value Ref Range   Lipase 22 22 - 51 U/L  Comprehensive metabolic panel  Result Value Ref Range   Sodium 135 135 - 145 mmol/L   Potassium 4.0 3.5 - 5.1 mmol/L   Chloride 103 101 - 111 mmol/L   CO2 24 22 - 32 mmol/L   Glucose, Bld 93 65 - 99 mg/dL   BUN 10 6 - 20 mg/dL   Creatinine, Ser 2.44 0.44 - 1.00 mg/dL   Calcium 9.7 8.9 - 01.0 mg/dL   Total Protein 8.2 (H) 6.5 - 8.1 g/dL   Albumin 4.3 3.5 - 5.0 g/dL   AST 24 15 - 41 U/L   ALT 23 14 - 54 U/L   Alkaline Phosphatase 64 38 - 126 U/L   Total Bilirubin 0.6 0.3 - 1.2 mg/dL   GFR calc non Af Amer >60 >60 mL/min   GFR calc Af Amer >60 >60 mL/min   Anion gap 8 5 - 15  CBC  Result Value Ref Range   WBC 6.8 4.0 - 10.5 K/uL   RBC 4.23 3.87 - 5.11 MIL/uL   Hemoglobin 12.7 12.0 - 15.0 g/dL   HCT 27.2 53.6 - 64.4 %   MCV 89.8 78.0 - 100.0 fL   MCH 30.0 26.0 - 34.0 pg   MCHC 33.4 30.0 - 36.0 g/dL   RDW 03.4 74.2 - 59.5 %   Platelets 264 150 - 400 K/uL  Urinalysis, Routine w reflex microscopic (not at [facility])  Result Value Ref Range   Color, Urine YELLOW YELLOW   APPearance CLOUDY (A) CLEAR   Specific Gravity, Urine 1.015 1.005 - 1.030   pH 6.0 5.0 - 8.0   Glucose, UA NEGATIVE NEGATIVE mg/dL   Hgb urine dipstick NEGATIVE NEGATIVE   Bilirubin Urine NEGATIVE NEGATIVE   Ketones, ur NEGATIVE NEGATIVE mg/dL   Protein, ur NEGATIVE NEGATIVE mg/dL   Urobilinogen, UA 0.2 0.0 - 1.0 mg/dL   Nitrite NEGATIVE NEGATIVE   Leukocytes, UA SMALL (A) NEGATIVE  Urine microscopic-add on  Result Value Ref Range   Squamous Epithelial / LPF FEW (A) RARE   WBC, UA 3-6 <3 WBC/hpf   RBC / HPF 0-2 <3 RBC/hpf   Bacteria, UA FEW (A) RARE   Urine-Other RARE YEAST   Pregnancy, urine  Result Value Ref Range   Preg Test, Ur NEGATIVE NEGATIVE  Dg Chest 2 View  07/02/2015   CLINICAL DATA:  Right upper quadrant pain  EXAM: CHEST  2 VIEW  COMPARISON:  04/08/2012  FINDINGS: The heart size and mediastinal contours are within normal limits. Both lungs are clear. The visualized skeletal structures are unremarkable.  IMPRESSION: No active cardiopulmonary disease.   Electronically Signed   By: Natasha Mead M.D.   On: 07/02/2015 15:52   Dg Cholangiogram Operative  07/03/2015   CLINICAL DATA:  Laparoscopic cholecystectomy  EXAM: INTRAOPERATIVE CHOLANGIOGRAM  FLUOROSCOPY TIME:  7 seconds  COMPARISON:  Abdominal ultrasound -07/02/2015  FINDINGS: Intraoperative cholangiographic images of the right upper abdominal quadrant during laparoscopic cholecystectomy are provided for review.  Surgical clips overlie the expected location of the gallbladder fossa.  Contrast injection demonstrates selective cannulation of the central aspect of the cystic duct.  There is passage of contrast through  the central aspect of the cystic duct with filling of a non dilated common bile duct. There is passage of contrast though the CBD and into the descending portion of the duodenum.  There is minimal reflux of injected contrast into the common hepatic duct and central aspect of the non dilated intrahepatic biliary system. There is partial drainage of the right anterior biliary system into the central aspect of the left intrahepatic biliary system.  There are no discrete filling defects within the opacified portions of the biliary system to suggest the presence of choledocholithiasis.  IMPRESSION: No evidence of choledocholithiasis.   Electronically Signed   By: Simonne Come M.D.   On: 07/03/2015 12:35   US Abdomen Limited Ruq  07/02/2015   CLINICAL DATA:  Right upper quadrant pain for 1 day  EXAM: US ABDOMEN LIMITED - RIGHT UPPER QUADRANT  COMPARISON:  None.  FINDINGS: Gallbladder:  Well distended with multiple gallstones. No wall thickening or pericholecystic fluid is noted. A negative sonographic Eulah Pont sign is seen.  Common bile duct:  Diameter: 3.3 mm.  Liver:  No focal lesion identified. Within normal limits in parenchymal echogenicity.  IMPRESSION: Multiple gallstones without complicating factors.   Electronically Signed   By: Alcide Clever M.D.   On: 07/02/2015 13:25    Discharge Exam:  Filed Vitals:   07/04/15 0553  BP: 94/57  Pulse: 93  Temp: 97.8 F (36.6 C)  Resp: 16    General: WN obese young F who is alert and generally healthy appearing.  Lungs: Clear to auscultation and symmetric breath sounds. Heart:  RRR. No murmur or rub. Abdomen: Soft. No mass. Normal bowel sounds. Incisions look good.  Discharge Medications:     Medication List    ASK your doctor about these medications        acetaminophen 500 MG tablet  Commonly known as:  TYLENOL  Take 1,000 mg by mouth every 6 (six) hours as needed for mild pain, moderate pain or headache.     escitalopram 20 MG tablet  Commonly  known as:  LEXAPRO  Take 1 tablet (20 mg total) by mouth daily.     FLUoxetine 20 MG capsule  Commonly known as:  PROZAC  Take 60 mg by mouth daily.     multivitamin with minerals Tabs tablet  Take 1 tablet by mouth daily.     norethindrone-ethinyl estradiol 1-20 MG-MCG tablet  Commonly known as:  JUNEL FE,GILDESS FE,LOESTRIN FE  Take 1 tablet by mouth.     pantoprazole 20 MG tablet  Commonly known as:  PROTONIX  Take 1 tablet (20 mg total) by mouth  once.     pantoprazole 40 MG tablet  Commonly known as:  PROTONIX  Take 40 mg by mouth daily.     ranitidine 150 MG capsule  Commonly known as:  ZANTAC  Take 150 mg by mouth at bedtime.        Disposition: 01-Home or Self Care       Signed: Ovidio Kin, M.D., [facility] Office:  [phone]  07/04/2015, 9:25 AM

## 2015-07-05 ENCOUNTER — Encounter (HOSPITAL_COMMUNITY): Payer: Self-pay | Admitting: Surgery
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
## 2015-11-03 IMAGING — CR DG CHEST 2V
2 series · 2 of 2 positions shown · non-contrast
Comparison: 04/08/2012

CLINICAL DATA: Right upper quadrant pain

EXAM:
CHEST  2 VIEW

[w chest pa]
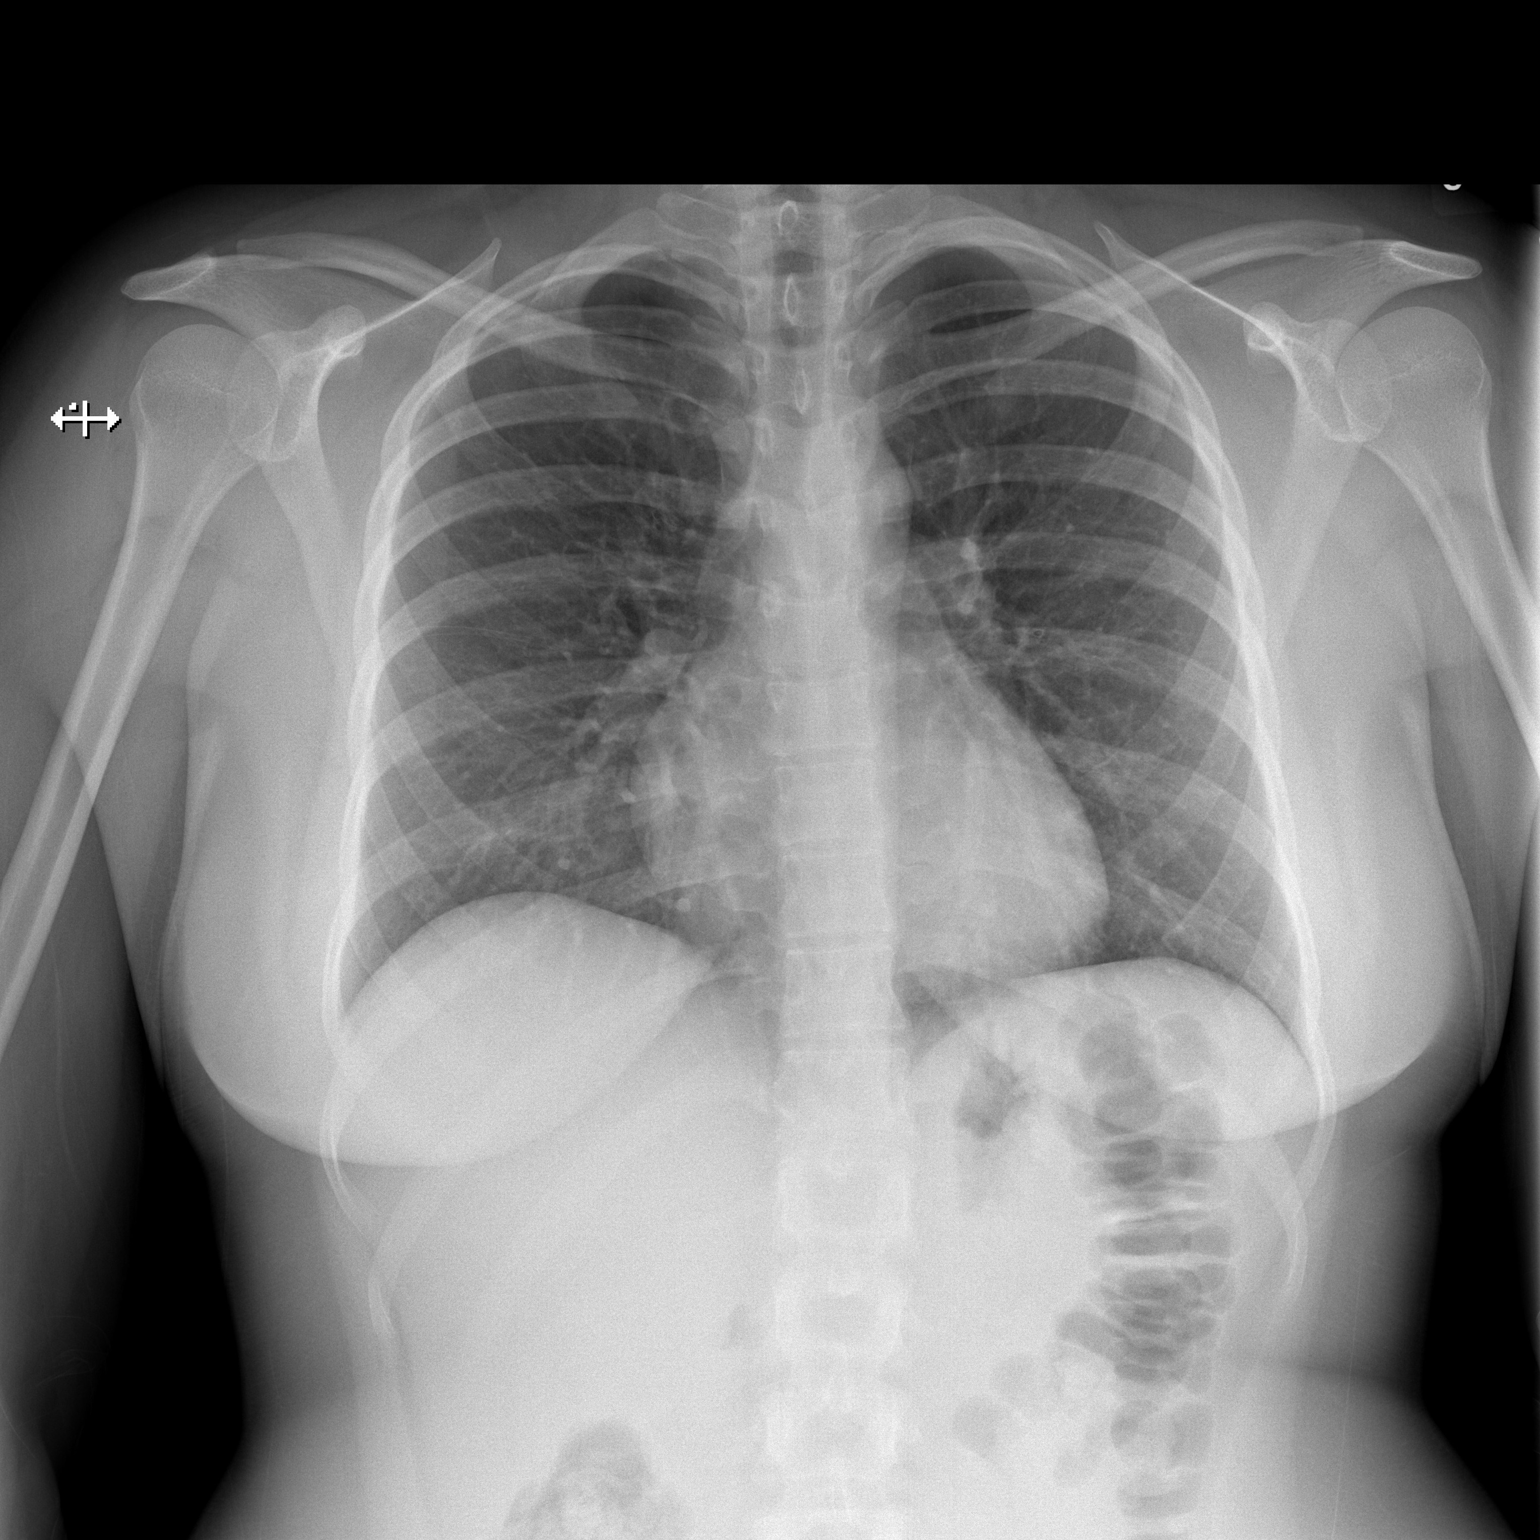

[w chest lat]
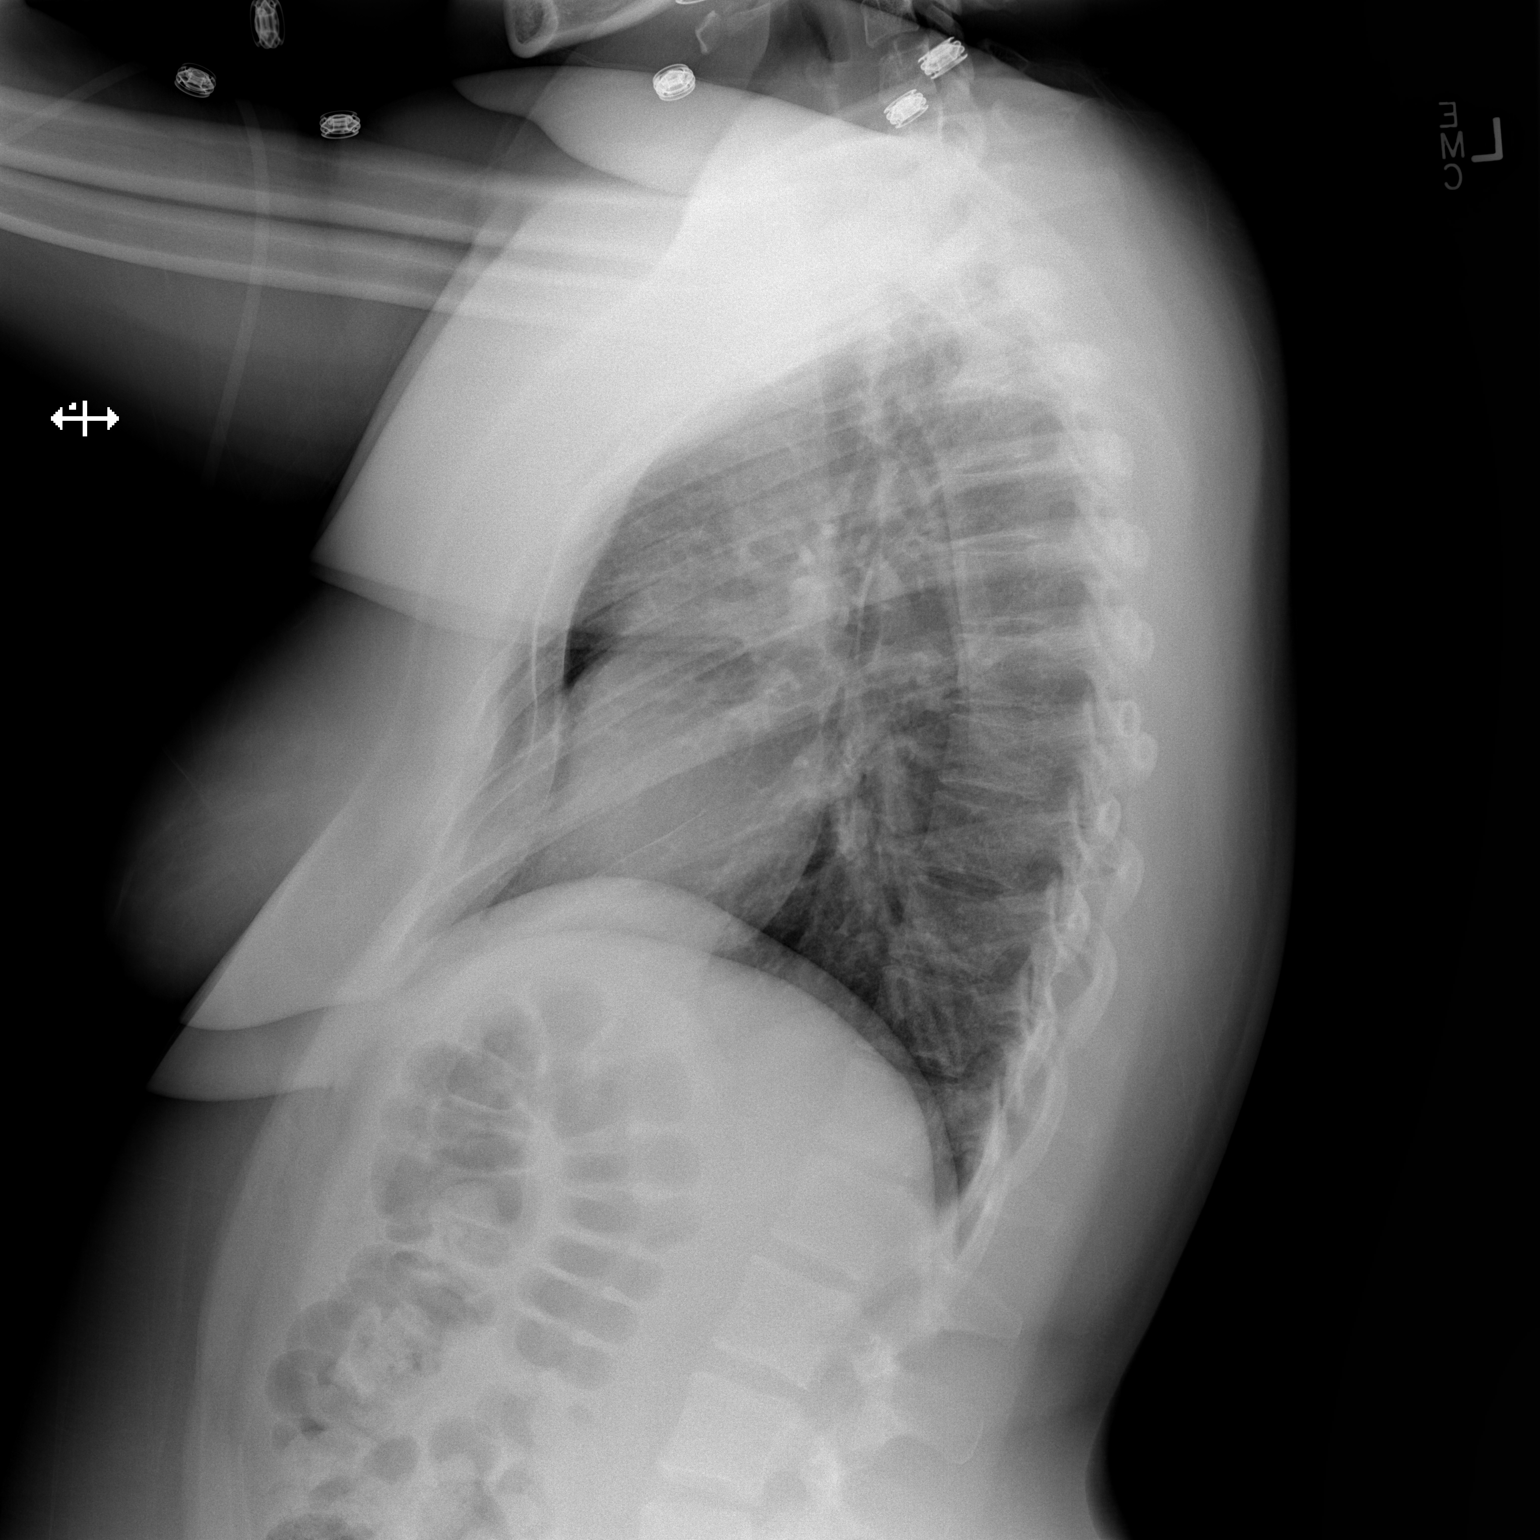

[2 of 2 positions shown; findings below may reference images not displayed]

FINDINGS: The heart size and mediastinal contours are within normal limits.
Both lungs are clear. The visualized skeletal structures are
unremarkable.
IMPRESSION: No active cardiopulmonary disease.

## 2016-09-05 IMAGING — US US ABDOMEN LIMITED
1 series · 14 of 25 positions shown · non-contrast
Comparison: None.

CLINICAL DATA: Right upper quadrant pain for 1 day

EXAM:
US ABDOMEN LIMITED - RIGHT UPPER QUADRANT

[Series 1: us abdomen limited · 0.21mm/px · 14 of 58 slices shown]
[im 1/58]
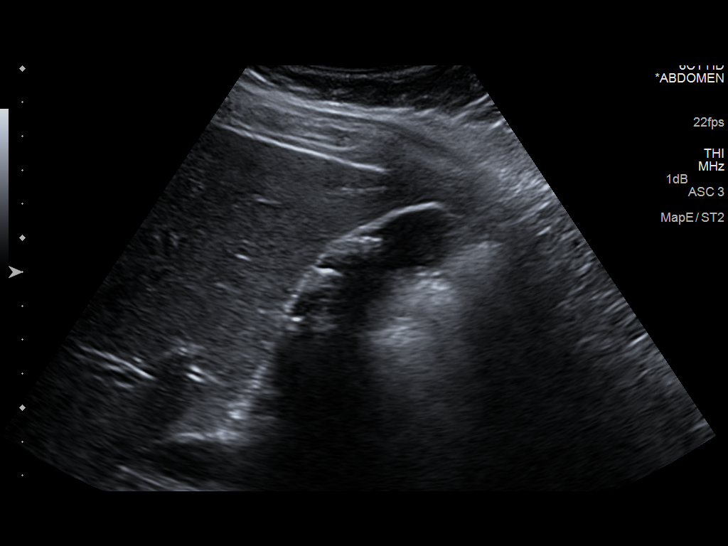
[im 5/58]
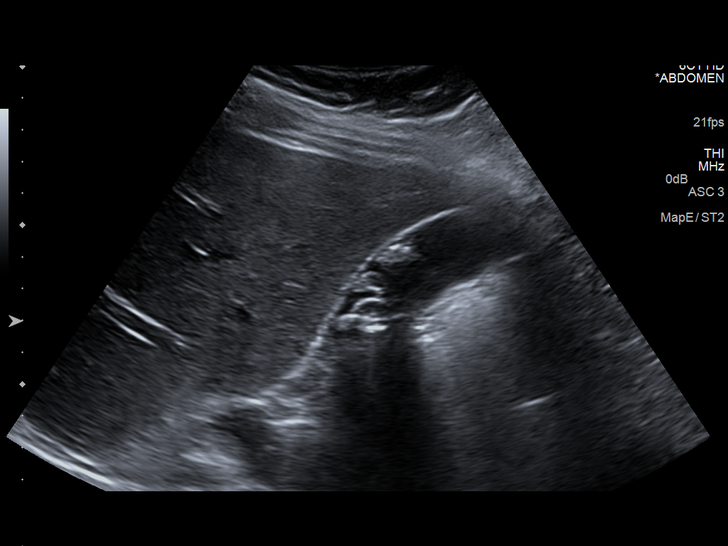
[im 10/58]
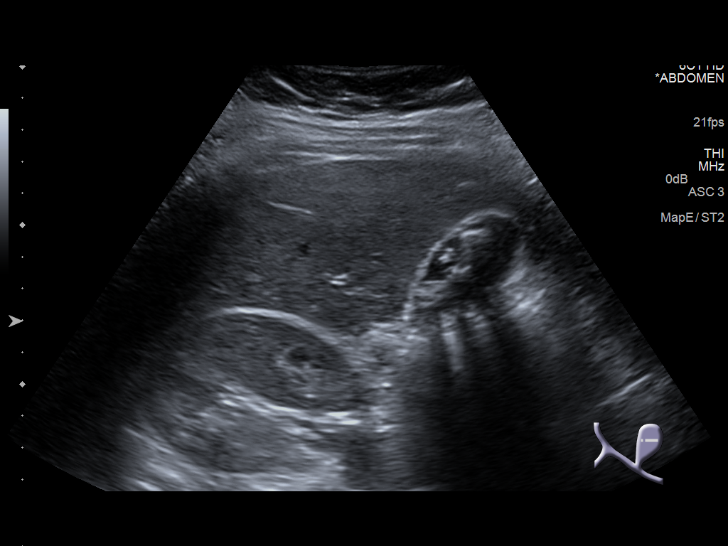
[im 15/58]
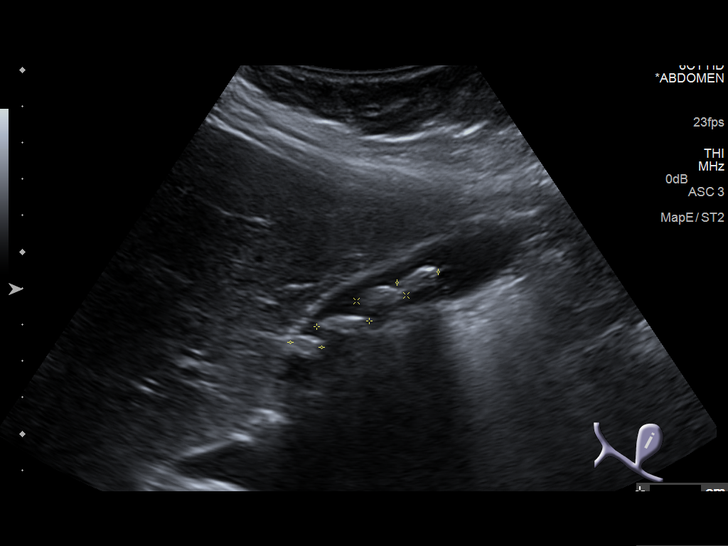
[im 20/58]
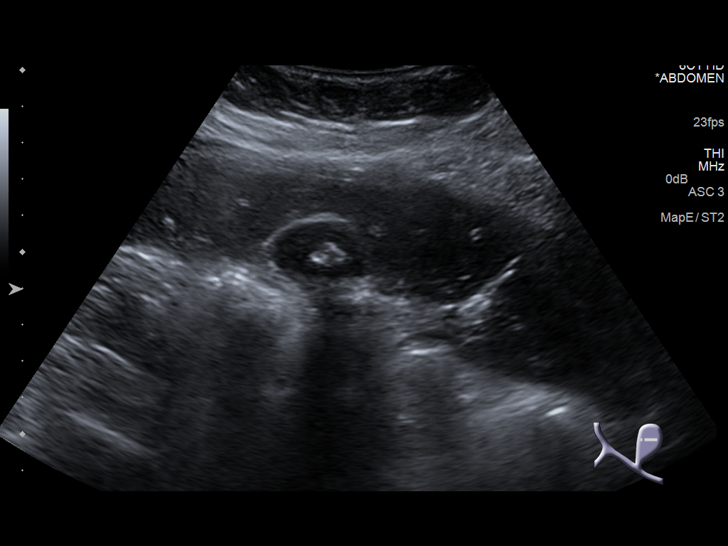
[im 22/58]
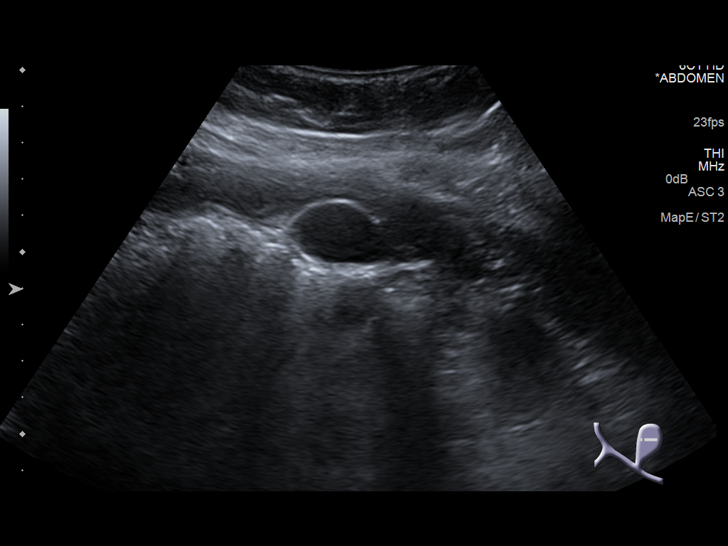
[im 27/58]
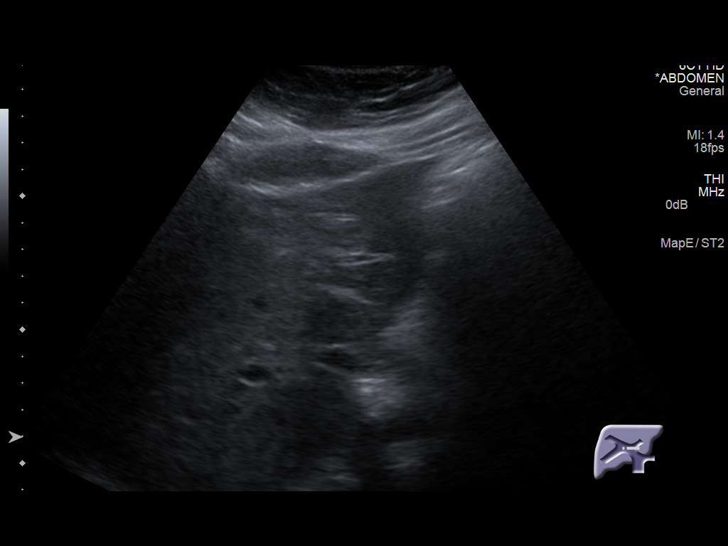
[im 31/58]
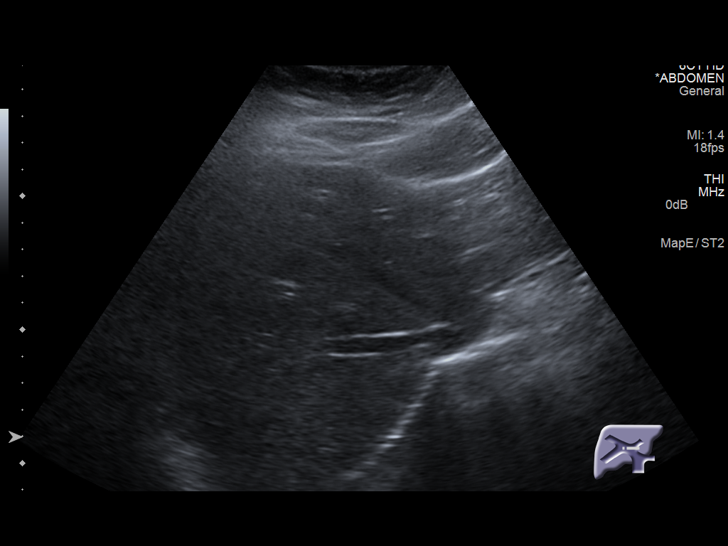
[im 36/58]
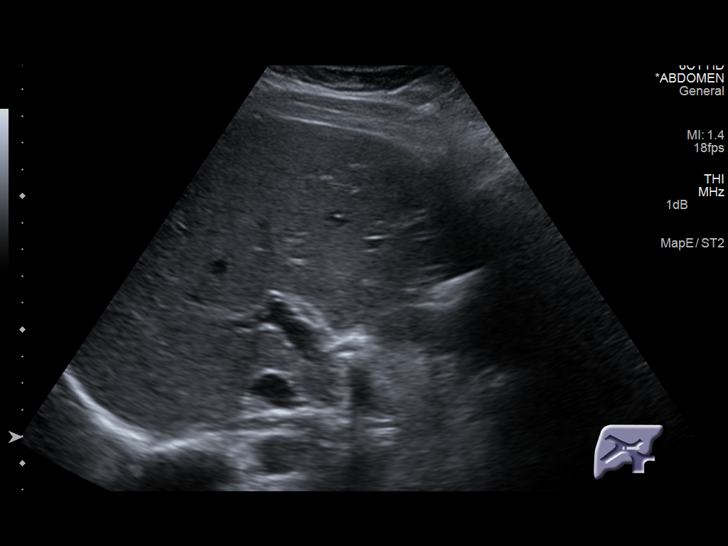
[im 39/58]
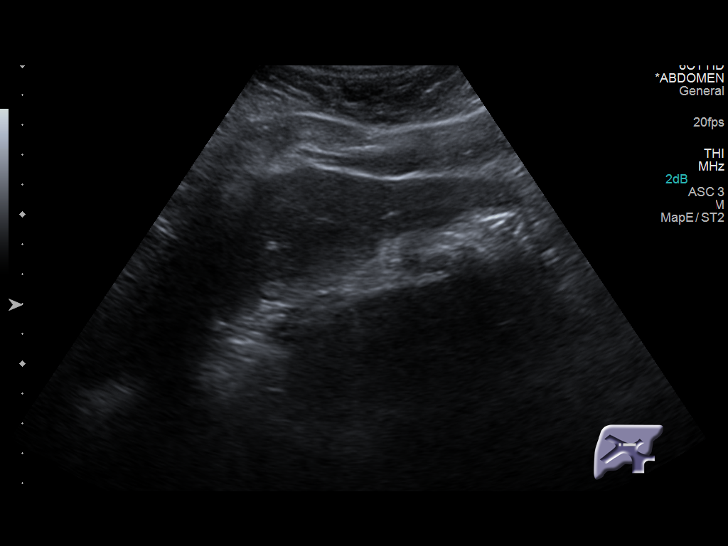
[im 43/58]
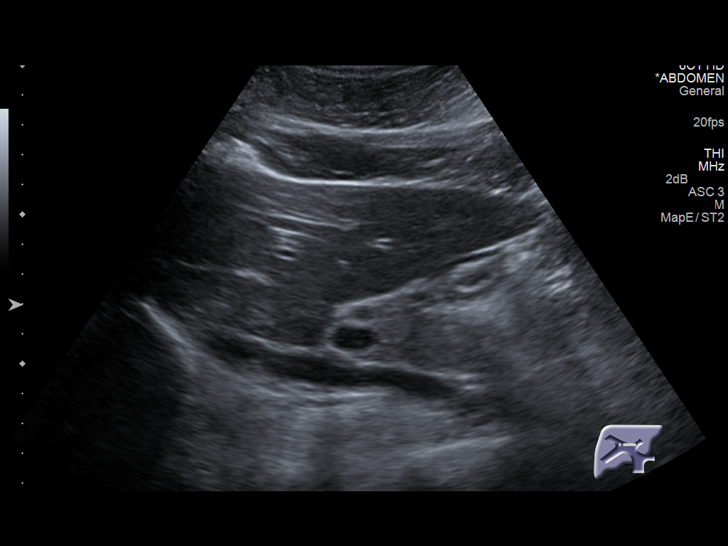
[im 48/58]
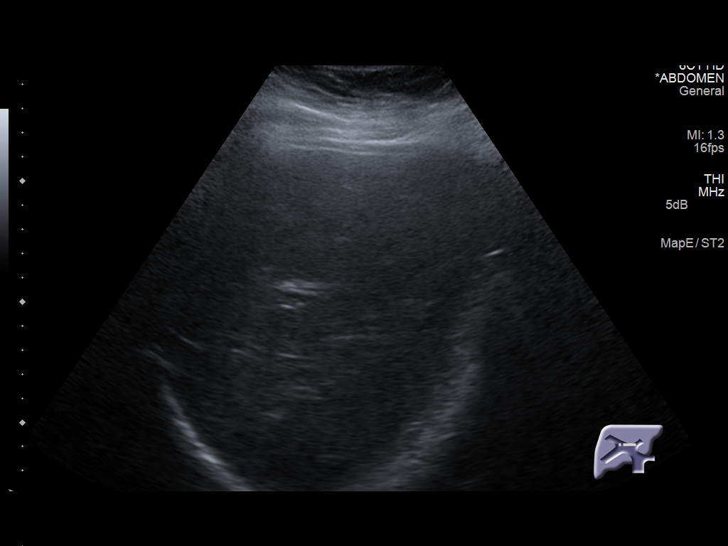
[im 53/58]
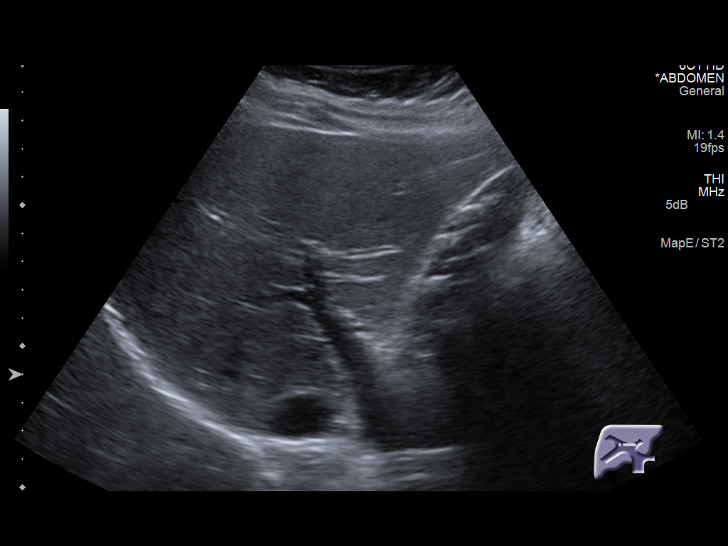
[im 58/58]
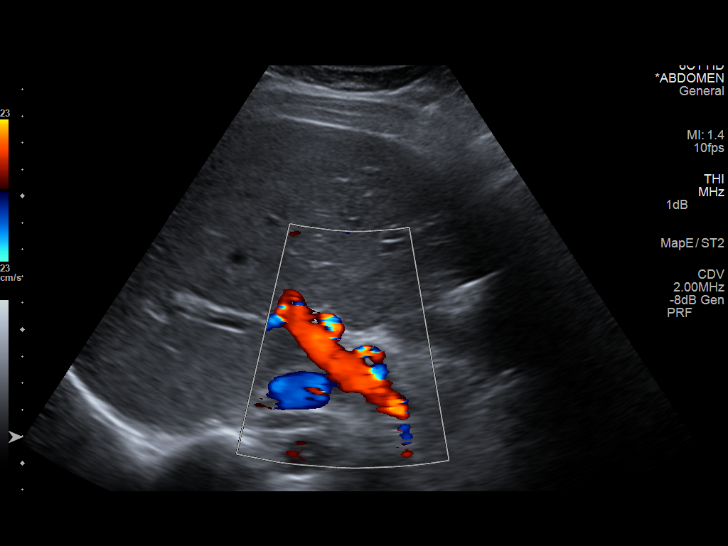

[14 of 25 positions shown; findings below may reference images not displayed]

FINDINGS: Gallbladder:

Well distended with multiple gallstones. No wall thickening or
pericholecystic fluid is noted. A negative sonographic Murphy sign
is seen.

Common bile duct:

Diameter: 3.3 mm.

Liver:

No focal lesion identified. Within normal limits in parenchymal
echogenicity.
IMPRESSION: Multiple gallstones without complicating factors.
